# Patient Record
Sex: Female | Born: 1985 | Race: Black or African American | Hispanic: No | Marital: Married | Smoking: Never smoker
Health system: Southern US, Community
[De-identification: ages and names within clinical notes are randomized; demographics above are authoritative.]

## PROBLEM LIST (undated history)

## (undated) DIAGNOSIS — J302 Other seasonal allergic rhinitis: Secondary | ICD-10-CM

## (undated) DIAGNOSIS — L309 Dermatitis, unspecified: Secondary | ICD-10-CM

## (undated) DIAGNOSIS — Z8619 Personal history of other infectious and parasitic diseases: Secondary | ICD-10-CM

## (undated) DIAGNOSIS — N390 Urinary tract infection, site not specified: Secondary | ICD-10-CM

## (undated) DIAGNOSIS — Z973 Presence of spectacles and contact lenses: Secondary | ICD-10-CM

## (undated) DIAGNOSIS — D509 Iron deficiency anemia, unspecified: Secondary | ICD-10-CM

## (undated) DIAGNOSIS — O99119 Other diseases of the blood and blood-forming organs and certain disorders involving the immune mechanism complicating pregnancy, unspecified trimester: Secondary | ICD-10-CM

## (undated) DIAGNOSIS — D696 Thrombocytopenia, unspecified: Secondary | ICD-10-CM

## (undated) DIAGNOSIS — D259 Leiomyoma of uterus, unspecified: Secondary | ICD-10-CM

## (undated) HISTORY — DX: Dermatitis, unspecified: L30.9

## (undated) HISTORY — DX: Thrombocytopenia, unspecified: D69.6

## (undated) HISTORY — DX: Urinary tract infection, site not specified: N39.0

## (undated) HISTORY — PX: WRIST SURGERY: SHX841

## (undated) HISTORY — DX: Thrombocytopenia, unspecified: O99.119

## (undated) HISTORY — DX: Personal history of other infectious and parasitic diseases: Z86.19

---

## 2010-06-01 DIAGNOSIS — L258 Unspecified contact dermatitis due to other agents: Secondary | ICD-10-CM | POA: Insufficient documentation

## 2013-07-18 ENCOUNTER — Encounter: Payer: Self-pay | Admitting: Obstetrics & Gynecology

## 2013-08-01 ENCOUNTER — Encounter: Payer: Self-pay | Admitting: Nurse Practitioner

## 2013-08-15 ENCOUNTER — Encounter: Payer: Self-pay | Admitting: Obstetrics and Gynecology

## 2013-08-15 ENCOUNTER — Ambulatory Visit (INDEPENDENT_AMBULATORY_CARE_PROVIDER_SITE_OTHER): Payer: BC Managed Care – PPO | Admitting: Obstetrics and Gynecology

## 2013-08-15 VITALS — BP 118/89 | HR 93 | Ht 64.0 in | Wt 169.0 lb

## 2013-08-15 DIAGNOSIS — Z01419 Encounter for gynecological examination (general) (routine) without abnormal findings: Secondary | ICD-10-CM

## 2013-08-15 DIAGNOSIS — N898 Other specified noninflammatory disorders of vagina: Secondary | ICD-10-CM

## 2013-08-15 DIAGNOSIS — Z124 Encounter for screening for malignant neoplasm of cervix: Secondary | ICD-10-CM

## 2013-08-15 MED ORDER — NORGESTIMATE-ETH ESTRADIOL 0.25-35 MG-MCG PO TABS: 1.0000 | ORAL_TABLET | Freq: Every day | ORAL | Status: DC

## 2013-08-15 NOTE — Patient Instructions (Signed)
Preventive Care for Adults, Female A healthy lifestyle and preventive care can promote health and wellness. Preventive health guidelines for women include the following key practices.  A routine yearly physical is a good way to check with your health care provider about your health and preventive screening. It is a chance to share any concerns and updates on your health and to receive a thorough exam.  Visit your dentist for a routine exam and preventive care every 6 months. Brush your teeth twice a day and floss once a day. Good oral hygiene prevents tooth decay and gum disease.  The frequency of eye exams is based on your age, health, family medical history, use of contact lenses, and other factors. Follow your health care provider's recommendations for frequency of eye exams.  Eat a healthy diet. Foods like vegetables, fruits, whole grains, low-fat dairy products, and lean protein foods contain the nutrients you need without too many calories. Decrease your intake of foods high in solid fats, added sugars, and salt. Eat the right amount of calories for you.Get information about a proper diet from your health care provider, if necessary.  Regular physical exercise is one of the most important things you can do for your health. Most adults should get at least 150 minutes of moderate-intensity exercise (any activity that increases your heart rate and causes you to sweat) each week. In addition, most adults need muscle-strengthening exercises on 2 or more days a week.  Maintain a healthy weight. The body mass index (BMI) is a screening tool to identify possible weight problems. It provides an estimate of body fat based on height and weight. Your health care provider can find your BMI, and can help you achieve or maintain a healthy weight.For adults 20 years and older:  A BMI below 18.5 is considered underweight.  A BMI of 18.5 to 24.9 is normal.  A BMI of 25 to 29.9 is considered  overweight.  A BMI of 30 and above is considered obese.  Maintain normal blood lipids and cholesterol levels by exercising and minimizing your intake of saturated fat. Eat a balanced diet with plenty of fruit and vegetables. Blood tests for lipids and cholesterol should begin at age 20 and be repeated every 5 years. If your lipid or cholesterol levels are high, you are over 50, or you are at high risk for heart disease, you may need your cholesterol levels checked more frequently.Ongoing high lipid and cholesterol levels should be treated with medicines if diet and exercise are not working.  If you smoke, find out from your health care provider how to quit. If you do not use tobacco, do not start.  Lung cancer screening is recommended for adults aged 55 80 years who are at high risk for developing lung cancer because of a history of smoking. A yearly low-dose CT scan of the lungs is recommended for people who have at least a 30-pack-year history of smoking and are a current smoker or have quit within the past 15 years. A pack year of smoking is smoking an average of 1 pack of cigarettes a day for 1 year (for example: 1 pack a day for 30 years or 2 packs a day for 15 years). Yearly screening should continue until the smoker has stopped smoking for at least 15 years. Yearly screening should be stopped for people who develop a health problem that would prevent them from having lung cancer treatment.  If you are pregnant, do not drink alcohol. If you   are breastfeeding, be very cautious about drinking alcohol. If you are not pregnant and choose to drink alcohol, do not have more than 1 drink per day. One drink is considered to be 12 ounces (355 mL) of beer, 5 ounces (148 mL) of wine, or 1.5 ounces (44 mL) of liquor.  Avoid use of street drugs. Do not share needles with anyone. Ask for help if you need support or instructions about stopping the use of drugs.  High blood pressure causes heart disease and  increases the risk of stroke. Your blood pressure should be checked at least every 1 to 2 years. Ongoing high blood pressure should be treated with medicines if weight loss and exercise do not work.  If you are 20 30 years old, ask your health care provider if you should take aspirin to prevent strokes.  Diabetes screening involves taking a blood sample to check your fasting blood sugar level. This should be done once every 3 years, after age 35, if you are within normal weight and without risk factors for diabetes. Testing should be considered at a younger age or be carried out more frequently if you are overweight and have at least 1 risk factor for diabetes.  Breast cancer screening is essential preventive care for women. You should practice "breast self-awareness." This means understanding the normal appearance and feel of your breasts and may include breast self-examination. Any changes detected, no matter how small, should be reported to a health care provider. Women in their 42s and 30s should have a clinical breast exam (CBE) by a health care provider as part of a regular health exam every 1 to 3 years. After age 74, women should have a CBE every year. Starting at age 43, women should consider having a mammogram (breast X-ray test) every year. Women who have a family history of breast cancer should talk to their health care provider about genetic screening. Women at a high risk of breast cancer should talk to their health care providers about having an MRI and a mammogram every year.  Breast cancer gene (BRCA)-related cancer risk assessment is recommended for women who have family members with BRCA-related cancers. BRCA-related cancers include breast, ovarian, tubal, and peritoneal cancers. Having family members with these cancers may be associated with an increased risk for harmful changes (mutations) in the breast cancer genes BRCA1 and BRCA2. Results of the assessment will determine the need for  genetic counseling and BRCA1 and BRCA2 testing.  The Pap test is a screening test for cervical cancer. A Pap test can show cell changes on the cervix that might become cervical cancer if left untreated. A Pap test is a procedure in which cells are obtained and examined from the lower end of the uterus (cervix).  Women should have a Pap test starting at age 60.  Between ages 63 and 62, Pap tests should be repeated every 2 years.  Beginning at age 43, you should have a Pap test every 3 years as long as the past 3 Pap tests have been normal.  Some women have medical problems that increase the chance of getting cervical cancer. Talk to your health care provider about these problems. It is especially important to talk to your health care provider if a new problem develops soon after your last Pap test. In these cases, your health care provider may recommend more frequent screening and Pap tests.  The above recommendations are the same for women who have or have not gotten the vaccine  for human papillomavirus (HPV).  If you had a hysterectomy for a problem that was not cancer or a condition that could lead to cancer, then you no longer need Pap tests. Even if you no longer need a Pap test, a regular exam is a good idea to make sure no other problems are starting.  If you are between ages 65 and 70 years, and you have had normal Pap tests going back 10 years, you no longer need Pap tests. Even if you no longer need a Pap test, a regular exam is a good idea to make sure no other problems are starting.  If you have had past treatment for cervical cancer or a condition that could lead to cancer, you need Pap tests and screening for cancer for at least 20 years after your treatment.  If Pap tests have been discontinued, risk factors (such as a new sexual partner) need to be reassessed to determine if screening should be resumed.  The HPV test is an additional test that may be used for cervical cancer  screening. The HPV test looks for the virus that can cause the cell changes on the cervix. The cells collected during the Pap test can be tested for HPV. The HPV test could be used to screen women aged 30 years and older, and should be used in women of any age who have unclear Pap test results. After the age of 30, women should have HPV testing at the same frequency as a Pap test.  Colorectal cancer can be detected and often prevented. Most routine colorectal cancer screening begins at the age of 50 years and continues through age 75 years. However, your health care provider may recommend screening at an earlier age if you have risk factors for colon cancer. On a yearly basis, your health care provider may provide home test kits to check for hidden blood in the stool. Use of a small camera at the end of a tube, to directly examine the colon (sigmoidoscopy or colonoscopy), can detect the earliest forms of colorectal cancer. Talk to your health care provider about this at age 50, when routine screening begins. Direct exam of the colon should be repeated every 5 10 years through age 75 years, unless early forms of pre-cancerous polyps or small growths are found.  People who are at an increased risk for hepatitis B should be screened for this virus. You are considered at high risk for hepatitis B if:  You were born in a country where hepatitis B occurs often. Talk with your health care provider about which countries are considered high risk.  Your parents were born in a high-risk country and you have not received a shot to protect against hepatitis B (hepatitis B vaccine).  You have HIV or AIDS.  You use needles to inject street drugs.  You live with, or have sex with, someone who has Hepatitis B.  You get hemodialysis treatment.  You take certain medicines for conditions like cancer, organ transplantation, and autoimmune conditions.  Hepatitis C blood testing is recommended for all people born from  1945 through 1965 and any individual with known risks for hepatitis C.  Practice safe sex. Use condoms and avoid high-risk sexual practices to reduce the spread of sexually transmitted infections (STIs). STIs include gonorrhea, chlamydia, syphilis, trichomonas, herpes, HPV, and human immunodeficiency virus (HIV). Herpes, HIV, and HPV are viral illnesses that have no cure. They can result in disability, cancer, and death. Sexually active women aged 25   years and younger should be checked for chlamydia. Older women with new or multiple partners should also be tested for chlamydia. Testing for other STIs is recommended if you are sexually active and at increased risk.  Osteoporosis is a disease in which the bones lose minerals and strength with aging. This can result in serious bone fractures or breaks. The risk of osteoporosis can be identified using a bone density scan. Women ages 65 years and over and women at risk for fractures or osteoporosis should discuss screening with their health care providers. Ask your health care provider whether you should take a calcium supplement or vitamin D to reduce the rate of osteoporosis.  Menopause can be associated with physical symptoms and risks. Hormone replacement therapy is available to decrease symptoms and risks. You should talk to your health care provider about whether hormone replacement therapy is right for you.  Use sunscreen. Apply sunscreen liberally and repeatedly throughout the day. You should seek shade when your shadow is shorter than you. Protect yourself by wearing long sleeves, pants, a wide-brimmed hat, and sunglasses year round, whenever you are outdoors.  Once a month, do a whole body skin exam, using a mirror to look at the skin on your back. Tell your health care provider of new moles, moles that have irregular borders, moles that are larger than a pencil eraser, or moles that have changed in shape or color.  Stay current with required  vaccines (immunizations).  Influenza vaccine. All adults should be immunized every year.  Tetanus, diphtheria, and acellular pertussis (Td, Tdap) vaccine. Pregnant women should receive 1 dose of Tdap vaccine during each pregnancy. The dose should be obtained regardless of the length of time since the last dose. Immunization is preferred during the 27th 36th week of gestation. An adult who has not previously received Tdap or who does not know her vaccine status should receive 1 dose of Tdap. This initial dose should be followed by tetanus and diphtheria toxoids (Td) booster doses every 10 years. Adults with an unknown or incomplete history of completing a 3-dose immunization series with Td-containing vaccines should begin or complete a primary immunization series including a Tdap dose. Adults should receive a Td booster every 10 years.  Varicella vaccine. An adult without evidence of immunity to varicella should receive 2 doses or a second dose if she has previously received 1 dose. Pregnant females who do not have evidence of immunity should receive the first dose after pregnancy. This first dose should be obtained before leaving the health care facility. The second dose should be obtained 4 8 weeks after the first dose.  Human papillomavirus (HPV) vaccine. Females aged 13 26 years who have not received the vaccine previously should obtain the 3-dose series. The vaccine is not recommended for use in pregnant females. However, pregnancy testing is not needed before receiving a dose. If a female is found to be pregnant after receiving a dose, no treatment is needed. In that case, the remaining doses should be delayed until after the pregnancy. Immunization is recommended for any person with an immunocompromised condition through the age of 26 years if she did not get any or all doses earlier. During the 3-dose series, the second dose should be obtained 4 8 weeks after the first dose. The third dose should be  obtained 24 weeks after the first dose and 16 weeks after the second dose.  Zoster vaccine. One dose is recommended for adults aged 60 years or older unless certain   conditions are present.  Measles, mumps, and rubella (MMR) vaccine. Adults born before 1957 generally are considered immune to measles and mumps. Adults born in 1957 or later should have 1 or more doses of MMR vaccine unless there is a contraindication to the vaccine or there is laboratory evidence of immunity to each of the three diseases. A routine second dose of MMR vaccine should be obtained at least 28 days after the first dose for students attending postsecondary schools, health care workers, or international travelers. People who received inactivated measles vaccine or an unknown type of measles vaccine during 1963 1967 should receive 2 doses of MMR vaccine. People who received inactivated mumps vaccine or an unknown type of mumps vaccine before 1979 and are at high risk for mumps infection should consider immunization with 2 doses of MMR vaccine. For females of childbearing age, rubella immunity should be determined. If there is no evidence of immunity, females who are not pregnant should be vaccinated. If there is no evidence of immunity, females who are pregnant should delay immunization until after pregnancy. Unvaccinated health care workers born before 1957 who lack laboratory evidence of measles, mumps, or rubella immunity or laboratory confirmation of disease should consider measles and mumps immunization with 2 doses of MMR vaccine or rubella immunization with 1 dose of MMR vaccine.  Pneumococcal 13-valent conjugate (PCV13) vaccine. When indicated, a person who is uncertain of her immunization history and has no record of immunization should receive the PCV13 vaccine. An adult aged 19 years or older who has certain medical conditions and has not been previously immunized should receive 1 dose of PCV13 vaccine. This PCV13 should be  followed with a dose of pneumococcal polysaccharide (PPSV23) vaccine. The PPSV23 vaccine dose should be obtained at least 8 weeks after the dose of PCV13 vaccine. An adult aged 19 years or older who has certain medical conditions and previously received 1 or more doses of PPSV23 vaccine should receive 1 dose of PCV13. The PCV13 vaccine dose should be obtained 1 or more years after the last PPSV23 vaccine dose.  Pneumococcal polysaccharide (PPSV23) vaccine. When PCV13 is also indicated, PCV13 should be obtained first. All adults aged 65 years and older should be immunized. An adult younger than age 65 years who has certain medical conditions should be immunized. Any person who resides in a nursing home or long-term care facility should be immunized. An adult smoker should be immunized. People with an immunocompromised condition and certain other conditions should receive both PCV13 and PPSV23 vaccines. People with human immunodeficiency virus (HIV) infection should be immunized as soon as possible after diagnosis. Immunization during chemotherapy or radiation therapy should be avoided. Routine use of PPSV23 vaccine is not recommended for American Indians, [place] Natives, or people younger than 65 years unless there are medical conditions that require PPSV23 vaccine. When indicated, people who have unknown immunization and have no record of immunization should receive PPSV23 vaccine. One-time revaccination 5 years after the first dose of PPSV23 is recommended for people aged 19 64 years who have chronic kidney failure, nephrotic syndrome, asplenia, or immunocompromised conditions. People who received 1 2 doses of PPSV23 before age 65 years should receive another dose of PPSV23 vaccine at age 65 years or later if at least 5 years have passed since the previous dose. Doses of PPSV23 are not needed for people immunized with PPSV23 at or after age 65 years.  Meningococcal vaccine. Adults with asplenia or persistent  complement component deficiencies should receive 2   doses of quadrivalent meningococcal conjugate (MenACWY-D) vaccine. The doses should be obtained at least 2 months apart. Microbiologists working with certain meningococcal bacteria, military recruits, people at risk during an outbreak, and people who travel to or live in countries with a high rate of meningitis should be immunized. A first-year college student up through age 21 years who is living in a residence hall should receive a dose if she did not receive a dose on or after her 16th birthday. Adults who have certain high-risk conditions should receive one or more doses of vaccine.  Hepatitis A vaccine. Adults who wish to be protected from this disease, have certain high-risk conditions, work with hepatitis A-infected animals, work in hepatitis A research labs, or travel to or work in countries with a high rate of hepatitis A should be immunized. Adults who were previously unvaccinated and who anticipate close contact with an international adoptee during the first 60 days after arrival in the [place] from a country with a high rate of hepatitis A should be immunized.  Hepatitis B vaccine. Adults who wish to be protected from this disease, have certain high-risk conditions, may be exposed to blood or other infectious body fluids, are household contacts or sex partners of hepatitis B positive people, are clients or workers in certain care facilities, or travel to or work in countries with a high rate of hepatitis B should be immunized.  Haemophilus influenzae type b (Hib) vaccine. A previously unvaccinated person with asplenia or sickle cell disease or having a scheduled splenectomy should receive 1 dose of Hib vaccine. Regardless of previous immunization, a recipient of a hematopoietic stem cell transplant should receive a 3-dose series 6 12 months after her successful transplant. Hib vaccine is not recommended for adults with HIV  infection. Preventive Services / Frequency Ages 19 to 39years  Blood pressure check.** / Every 1 to 2 years.  Lipid and cholesterol check.** / Every 5 years beginning at age 20.  Clinical breast exam.** / Every 3 years for women in their 20s and 30s.  BRCA-related cancer risk assessment.** / For women who have family members with a BRCA-related cancer (breast, ovarian, tubal, or peritoneal cancers).  Pap test.** / Every 2 years from ages 21 through 29. Every 3 years starting at age 30 through age 65 or 70 with a history of 3 consecutive normal Pap tests.  HPV screening.** / Every 3 years from ages 30 through ages 65 to 70 with a history of 3 consecutive normal Pap tests.  Hepatitis C blood test.** / For any individual with known risks for hepatitis C.  Skin self-exam. / Monthly.  Influenza vaccine. / Every year.  Tetanus, diphtheria, and acellular pertussis (Tdap, Td) vaccine.** / Consult your health care provider. Pregnant women should receive 1 dose of Tdap vaccine during each pregnancy. 1 dose of Td every 10 years.  Varicella vaccine.** / Consult your health care provider. Pregnant females who do not have evidence of immunity should receive the first dose after pregnancy.  HPV vaccine. / 3 doses over 6 months, if 26 and younger. The vaccine is not recommended for use in pregnant females. However, pregnancy testing is not needed before receiving a dose.  Measles, mumps, rubella (MMR) vaccine.** / You need at least 1 dose of MMR if you were born in 1957 or later. You may also need a 2nd dose. For females of childbearing age, rubella immunity should be determined. If there is no evidence of immunity, females who are not   pregnant should be vaccinated. If there is no evidence of immunity, females who are pregnant should delay immunization until after pregnancy.  Pneumococcal 13-valent conjugate (PCV13) vaccine.** / Consult your health care provider.  Pneumococcal polysaccharide (PPSV23)  vaccine.** / 1 to 2 doses if you smoke cigarettes or if you have certain conditions.  Meningococcal vaccine.** / 1 dose if you are age 88 to 41 years and a Market researcher living in a residence hall, or have one of several medical conditions, you need to get vaccinated against meningococcal disease. You may also need additional booster doses.  Hepatitis A vaccine.** / Consult your health care provider.  Hepatitis B vaccine.** / Consult your health care provider.  Haemophilus influenzae type b (Hib) vaccine.** / Consult your health care provider. Ages 45 to 64years  Blood pressure check.** / Every 1 to 2 years.  Lipid and cholesterol check.** / Every 5 years beginning at age 2 years.  Lung cancer screening. / Every year if you are aged 10 80 years and have a 30-pack-year history of smoking and currently smoke or have quit within the past 15 years. Yearly screening is stopped once you have quit smoking for at least 15 years or develop a health problem that would prevent you from having lung cancer treatment.  Clinical breast exam.** / Every year after age 28 years.  BRCA-related cancer risk assessment.** / For women who have family members with a BRCA-related cancer (breast, ovarian, tubal, or peritoneal cancers).  Mammogram.** / Every year beginning at age 63 years and continuing for as long as you are in good health. Consult with your health care provider.  Pap test.** / Every 3 years starting at age 71 years through age 39 or 90 years with a history of 3 consecutive normal Pap tests.  HPV screening.** / Every 3 years from ages 27 years through ages 32 to 72 years with a history of 3 consecutive normal Pap tests.  Fecal occult blood test (FOBT) of stool. / Every year beginning at age 40 years and continuing until age 29 years. You may not need to do this test if you get a colonoscopy every 10 years.  Flexible sigmoidoscopy or colonoscopy.** / Every 5 years for a flexible  sigmoidoscopy or every 10 years for a colonoscopy beginning at age 66 years and continuing until age 47 years.  Hepatitis C blood test.** / For all people born from [birth year] through 1967 and any individual with known risks for hepatitis C.  Skin self-exam. / Monthly.  Influenza vaccine. / Every year.  Tetanus, diphtheria, and acellular pertussis (Tdap/Td) vaccine.** / Consult your health care provider. Pregnant women should receive 1 dose of Tdap vaccine during each pregnancy. 1 dose of Td every 10 years.  Varicella vaccine.** / Consult your health care provider. Pregnant females who do not have evidence of immunity should receive the first dose after pregnancy.  Zoster vaccine.** / 1 dose for adults aged 39 years or older.  Measles, mumps, rubella (MMR) vaccine.** / You need at least 1 dose of MMR if you were born in 1959 or later. You may also need a 2nd dose. For females of childbearing age, rubella immunity should be determined. If there is no evidence of immunity, females who are not pregnant should be vaccinated. If there is no evidence of immunity, females who are pregnant should delay immunization until after pregnancy.  Pneumococcal 13-valent conjugate (PCV13) vaccine.** / Consult your health care provider.  Pneumococcal polysaccharide (PPSV23) vaccine.** / 1  to 2 doses if you smoke cigarettes or if you have certain conditions.  Meningococcal vaccine.** / Consult your health care provider.  Hepatitis A vaccine.** / Consult your health care provider.  Hepatitis B vaccine.** / Consult your health care provider.  Haemophilus influenzae type b (Hib) vaccine.** / Consult your health care provider. Ages 65 years and over  Blood pressure check.** / Every 1 to 2 years.  Lipid and cholesterol check.** / Every 5 years beginning at age 20 years.  Lung cancer screening. / Every year if you are aged 55 80 years and have a 30-pack-year history of smoking and currently smoke or have quit within the past 15 years.  Yearly screening is stopped once you have quit smoking for at least 15 years or develop a health problem that would prevent you from having lung cancer treatment.  Clinical breast exam.** / Every year after age 40 years.  BRCA-related cancer risk assessment.** / For women who have family members with a BRCA-related cancer (breast, ovarian, tubal, or peritoneal cancers).  Mammogram.** / Every year beginning at age 40 years and continuing for as long as you are in good health. Consult with your health care provider.  Pap test.** / Every 3 years starting at age 30 years through age 65 or 70 years with 3 consecutive normal Pap tests. Testing can be stopped between 65 and 70 years with 3 consecutive normal Pap tests and no abnormal Pap or HPV tests in the past 10 years.  HPV screening.** / Every 3 years from ages 30 years through ages 65 or 70 years with a history of 3 consecutive normal Pap tests. Testing can be stopped between 65 and 70 years with 3 consecutive normal Pap tests and no abnormal Pap or HPV tests in the past 10 years.  Fecal occult blood test (FOBT) of stool. / Every year beginning at age 50 years and continuing until age 75 years. You may not need to do this test if you get a colonoscopy every 10 years.  Flexible sigmoidoscopy or colonoscopy.** / Every 5 years for a flexible sigmoidoscopy or every 10 years for a colonoscopy beginning at age 50 years and continuing until age 75 years.  Hepatitis C blood test.** / For all people born from 1945 through 1965 and any individual with known risks for hepatitis C.  Osteoporosis screening.** / A one-time screening for women ages 65 years and over and women at risk for fractures or osteoporosis.  Skin self-exam. / Monthly.  Influenza vaccine. / Every year.  Tetanus, diphtheria, and acellular pertussis (Tdap/Td) vaccine.** / 1 dose of Td every 10 years.  Varicella vaccine.** / Consult your health care provider.  Zoster vaccine.** / 1  dose for adults aged 60 years or older.  Pneumococcal 13-valent conjugate (PCV13) vaccine.** / Consult your health care provider.  Pneumococcal polysaccharide (PPSV23) vaccine.** / 1 dose for all adults aged 65 years and older.  Meningococcal vaccine.** / Consult your health care provider.  Hepatitis A vaccine.** / Consult your health care provider.  Hepatitis B vaccine.** / Consult your health care provider.  Haemophilus influenzae type b (Hib) vaccine.** / Consult your health care provider. ** Family history and personal history of risk and conditions may change your health care provider's recommendations. Document Released: 07/18/2001 Document Revised: 03/12/2013 Document Reviewed: 10/17/2010 [employer] Patient Information 2014 [employer].  

## 2013-08-15 NOTE — Progress Notes (Signed)
  Subjective:     Denise Castillo is a 28 y.o. female G0P0 with LMP 08/06/2013 and BMI 29 who is here for a comprehensive physical exam. The patient reports no problems. Patient is sexually active using OCP for contraception. She also reports the occasional development of a discharge with fishy odor. She was tested and treated for BV and yeast recently. She reports that this odor seems to occur randomly and is well controlled on probiotics. Patient was tested for STD in November  History   Social History  . Marital Status: Single    Spouse Name: N/A    Number of Children: N/A  . Years of Education: N/A   Occupational History  . Not on file.   Social History Main Topics  . Smoking status: Never Smoker   . Smokeless tobacco: Never Used  . Alcohol Use: No  . Drug Use: No  . Sexual Activity: Yes    Partners: Male    Birth Control/ Protection: OCP, Pill   Other Topics Concern  . Not on file   Social History Narrative  . No narrative on file   No health maintenance topics applied.   History reviewed. No pertinent past medical history. History reviewed. No pertinent past surgical history. Family History  Problem Relation Age of Onset  . Hypertension Mother   . Hypertension Maternal Aunt   . Hypertension Maternal Uncle   . Hypertension Maternal Grandmother   . Hypertension Maternal Grandfather    History  Substance Use Topics  . Smoking status: Never Smoker   . Smokeless tobacco: Never Used  . Alcohol Use: No     Review of Systems A comprehensive review of systems was negative.   Objective:      GENERAL: Well-developed, well-nourished female in no acute distress.  HEENT: Normocephalic, atraumatic. Sclerae anicteric.  NECK: Supple. Normal thyroid.  LUNGS: Clear to auscultation bilaterally.  HEART: Regular rate and rhythm. BREASTS: Symmetric in size. No palpable masses or lymphadenopathy, skin changes, or nipple drainage. ABDOMEN: Soft, nontender, nondistended. No  organomegaly. PELVIC: Normal external female genitalia. Vagina is pink and rugated.  Normal discharge. Normal appearing cervix. Uterus is normal in size. No adnexal mass or tenderness. EXTREMITIES: No cyanosis, clubbing, or edema, 2+ distal pulses.    Assessment:    Healthy female exam.      Plan:    Pap smear collected Wet prep collected Patient advised to start self breast exam monthly Refill on Sprintec provided Patient advised to pay attention to certain vulva irritants such as personal soaps, detergent, pads, condoms and excessive washing which may all contribute to vaginitis. Patient agrees that she has noticed it more often with use of certain pads. Patient will be contacted with any abnormal results RTC in 1 year or prn See After Visit Summary for Counseling Recommendations

## 2013-08-16 LAB — WET PREP, GENITAL
CLUE CELLS WET PREP: NONE SEEN
Trich, Wet Prep: NONE SEEN
YEAST WET PREP: NONE SEEN

## 2014-01-16 ENCOUNTER — Ambulatory Visit (INDEPENDENT_AMBULATORY_CARE_PROVIDER_SITE_OTHER): Payer: BC Managed Care – PPO | Admitting: Obstetrics & Gynecology

## 2014-01-16 DIAGNOSIS — R300 Dysuria: Secondary | ICD-10-CM

## 2014-01-16 DIAGNOSIS — Z113 Encounter for screening for infections with a predominantly sexual mode of transmission: Secondary | ICD-10-CM

## 2014-01-16 NOTE — Progress Notes (Signed)
   Subjective:    Patient ID: Denise Castillo, female    DOB: [birth year], 28 y.o.   MRN: [id number]  HPI 28 yo S AA G0 here today because she had one occasion last week of left back pain while she was drinking. She was worried that she may have a UTI. She also continues to report an occasional "fishy" odor of her vagina. She takes a probiotic and uses a water device to push water into her vagina daily. This treatment does relieve the smell she reports.   Review of Systems     Objective:   Physical Exam No CVAT Abd- benign UA- entirely negative       Assessment & Plan:  Fishy odor per report- she had a negative wet prep recently and declines another exam today I will send a UC&S and call her if there is any growth I will check CT/GC today. Reassurance given

## 2014-01-18 LAB — URINE CULTURE
Colony Count: NO GROWTH
Organism ID, Bacteria: NO GROWTH

## 2014-01-19 LAB — GC/CHLAMYDIA PROBE AMP
CT PROBE, AMP APTIMA: NEGATIVE
GC Probe RNA: NEGATIVE

## 2014-08-17 ENCOUNTER — Other Ambulatory Visit: Payer: Self-pay | Admitting: Obstetrics and Gynecology

## 2014-08-19 LAB — CYTOLOGY - PAP

## 2015-07-16 ENCOUNTER — Other Ambulatory Visit: Payer: Self-pay | Admitting: Obstetrics and Gynecology

## 2015-07-16 ENCOUNTER — Other Ambulatory Visit (HOSPITAL_COMMUNITY)
Admission: RE | Admit: 2015-07-16 | Discharge: 2015-07-16 | Disposition: A | Discharge status: Home / Self Care | Payer: BLUE CROSS/BLUE SHIELD | Source: Ambulatory Visit | Attending: Obstetrics and Gynecology | Admitting: Obstetrics and Gynecology

## 2015-07-16 DIAGNOSIS — Z01419 Encounter for gynecological examination (general) (routine) without abnormal findings: Secondary | ICD-10-CM | POA: Insufficient documentation

## 2015-07-16 DIAGNOSIS — Z1151 Encounter for screening for human papillomavirus (HPV): Secondary | ICD-10-CM | POA: Insufficient documentation

## 2015-07-16 LAB — HM PAP SMEAR: HM Pap smear: NORMAL

## 2015-07-19 LAB — CYTOLOGY - PAP

## 2015-08-02 ENCOUNTER — Other Ambulatory Visit: Payer: Self-pay | Admitting: Obstetrics and Gynecology

## 2015-12-03 ENCOUNTER — Encounter (HOSPITAL_BASED_OUTPATIENT_CLINIC_OR_DEPARTMENT_OTHER): Payer: Self-pay | Admitting: *Deleted

## 2015-12-03 NOTE — Progress Notes (Signed)
NPO AFTER MN.  ARRIVE AT 1115.  NEEDS HG AND URINE PREG.

## 2015-12-14 ENCOUNTER — Encounter (HOSPITAL_BASED_OUTPATIENT_CLINIC_OR_DEPARTMENT_OTHER)
Admission: RE | Disposition: A | Discharge status: Home / Self Care | Payer: Self-pay | Source: Ambulatory Visit | Attending: Obstetrics and Gynecology

## 2015-12-14 ENCOUNTER — Ambulatory Visit (HOSPITAL_BASED_OUTPATIENT_CLINIC_OR_DEPARTMENT_OTHER): Payer: BLUE CROSS/BLUE SHIELD | Admitting: Anesthesiology

## 2015-12-14 ENCOUNTER — Ambulatory Visit (HOSPITAL_BASED_OUTPATIENT_CLINIC_OR_DEPARTMENT_OTHER)
Admission: RE | Admit: 2015-12-14 | Discharge: 2015-12-14 | Disposition: A | Discharge status: Home / Self Care | Payer: BLUE CROSS/BLUE SHIELD | Source: Ambulatory Visit | Attending: Obstetrics and Gynecology | Admitting: Obstetrics and Gynecology

## 2015-12-14 ENCOUNTER — Encounter (HOSPITAL_BASED_OUTPATIENT_CLINIC_OR_DEPARTMENT_OTHER): Payer: Self-pay | Admitting: *Deleted

## 2015-12-14 DIAGNOSIS — R102 Pelvic and perineal pain: Secondary | ICD-10-CM | POA: Insufficient documentation

## 2015-12-14 DIAGNOSIS — D259 Leiomyoma of uterus, unspecified: Secondary | ICD-10-CM | POA: Diagnosis present

## 2015-12-14 DIAGNOSIS — D251 Intramural leiomyoma of uterus: Secondary | ICD-10-CM | POA: Diagnosis not present

## 2015-12-14 DIAGNOSIS — N920 Excessive and frequent menstruation with regular cycle: Secondary | ICD-10-CM | POA: Diagnosis not present

## 2015-12-14 HISTORY — DX: Presence of spectacles and contact lenses: Z97.3

## 2015-12-14 HISTORY — DX: Leiomyoma of uterus, unspecified: D25.9

## 2015-12-14 HISTORY — PX: LAPAROSCOPIC GELPORT ASSISTED MYOMECTOMY: SHX6549

## 2015-12-14 HISTORY — DX: Iron deficiency anemia, unspecified: D50.9

## 2015-12-14 HISTORY — DX: Other seasonal allergic rhinitis: J30.2

## 2015-12-14 LAB — POCT PREGNANCY, URINE: Preg Test, Ur: NEGATIVE

## 2015-12-14 LAB — POCT HEMOGLOBIN-HEMACUE: Hemoglobin: 13.8 g/dL (ref 12.0–15.0)

## 2015-12-14 SURGERY — LAPAROSCOPIC GELPORT ASSISTED MYOMECTOMY
Anesthesia: General | Site: Abdomen

## 2015-12-14 MED ORDER — ONDANSETRON HCL 4 MG/2ML IJ SOLN
INTRAMUSCULAR | Status: AC
  Filled 2015-12-14: qty 2

## 2015-12-14 MED ORDER — MIDAZOLAM HCL 2 MG/2ML IJ SOLN
INTRAMUSCULAR | Status: AC
  Filled 2015-12-14: qty 2

## 2015-12-14 MED ORDER — DEXAMETHASONE SODIUM PHOSPHATE 4 MG/ML IJ SOLN
INTRAMUSCULAR | Status: DC | PRN
  Administered 2015-12-14: 8 mg via INTRAVENOUS

## 2015-12-14 MED ORDER — ONDANSETRON HCL 4 MG PO TABS: 4.0000 mg | ORAL_TABLET | Freq: Three times a day (TID) | ORAL | Status: DC | PRN

## 2015-12-14 MED ORDER — MEPERIDINE HCL 25 MG/ML IJ SOLN
6.2500 mg | INTRAMUSCULAR | Status: DC | PRN
  Filled 2015-12-14: qty 1

## 2015-12-14 MED ORDER — ROCURONIUM BROMIDE 100 MG/10ML IV SOLN
INTRAVENOUS | Status: DC | PRN
  Administered 2015-12-14: 50 mg via INTRAVENOUS

## 2015-12-14 MED ORDER — LIDOCAINE HCL (CARDIAC) 20 MG/ML IV SOLN
INTRAVENOUS | Status: AC
  Filled 2015-12-14: qty 5

## 2015-12-14 MED ORDER — LACTATED RINGERS IR SOLN
Status: DC | PRN
  Administered 2015-12-14: 3000 mL

## 2015-12-14 MED ORDER — GLYCOPYRROLATE 0.2 MG/ML IJ SOLN
INTRAMUSCULAR | Status: DC | PRN
  Administered 2015-12-14: 0.4 mg via INTRAVENOUS

## 2015-12-14 MED ORDER — DEXAMETHASONE SODIUM PHOSPHATE 10 MG/ML IJ SOLN
INTRAMUSCULAR | Status: AC
  Filled 2015-12-14: qty 1

## 2015-12-14 MED ORDER — SUGAMMADEX SODIUM 200 MG/2ML IV SOLN
INTRAVENOUS | Status: AC
  Filled 2015-12-14: qty 2

## 2015-12-14 MED ORDER — HYDROMORPHONE HCL 1 MG/ML IJ SOLN
INTRAMUSCULAR | Status: AC
  Filled 2015-12-14: qty 1

## 2015-12-14 MED ORDER — FENTANYL CITRATE (PF) 100 MCG/2ML IJ SOLN
INTRAMUSCULAR | Status: DC | PRN
  Administered 2015-12-14: 100 ug via INTRAVENOUS
  Administered 2015-12-14 (×3): 50 ug via INTRAVENOUS

## 2015-12-14 MED ORDER — SUGAMMADEX SODIUM 200 MG/2ML IV SOLN
INTRAVENOUS | Status: DC | PRN
  Administered 2015-12-14: 150.6 mg via INTRAVENOUS

## 2015-12-14 MED ORDER — PROPOFOL 10 MG/ML IV BOLUS
INTRAVENOUS | Status: DC | PRN
  Administered 2015-12-14: 125 mg via INTRAVENOUS

## 2015-12-14 MED ORDER — MIDAZOLAM HCL 2 MG/2ML IJ SOLN
INTRAMUSCULAR | Status: DC | PRN
  Administered 2015-12-14: 2 mg via INTRAVENOUS

## 2015-12-14 MED ORDER — ROCURONIUM BROMIDE 100 MG/10ML IV SOLN
INTRAVENOUS | Status: AC
  Filled 2015-12-14: qty 1

## 2015-12-14 MED ORDER — KETOROLAC TROMETHAMINE 30 MG/ML IJ SOLN
INTRAMUSCULAR | Status: DC | PRN
  Administered 2015-12-14: 30 mg via INTRAVENOUS

## 2015-12-14 MED ORDER — PROPOFOL 10 MG/ML IV BOLUS
INTRAVENOUS | Status: AC
  Filled 2015-12-14: qty 20

## 2015-12-14 MED ORDER — FENTANYL CITRATE (PF) 100 MCG/2ML IJ SOLN
INTRAMUSCULAR | Status: AC
  Filled 2015-12-14: qty 2

## 2015-12-14 MED ORDER — LIDOCAINE HCL (CARDIAC) 20 MG/ML IV SOLN
INTRAVENOUS | Status: DC | PRN
Start: 2015-12-14 — End: 2015-12-14
  Administered 2015-12-14: 100 mg via INTRAVENOUS

## 2015-12-14 MED ORDER — FENTANYL CITRATE (PF) 250 MCG/5ML IJ SOLN
INTRAMUSCULAR | Status: AC
  Filled 2015-12-14: qty 5

## 2015-12-14 MED ORDER — OXYCODONE-ACETAMINOPHEN 7.5-325 MG PO TABS: 1.0000 | ORAL_TABLET | ORAL | Status: DC | PRN

## 2015-12-14 MED ORDER — LACTATED RINGERS IV SOLN
INTRAVENOUS | Status: DC
  Administered 2015-12-14 (×4): via INTRAVENOUS
  Filled 2015-12-14: qty 1000

## 2015-12-14 MED ORDER — CEFAZOLIN SODIUM-DEXTROSE 2-4 GM/100ML-% IV SOLN
2.0000 g | INTRAVENOUS | Status: AC
  Administered 2015-12-14: 2 g via INTRAVENOUS
  Filled 2015-12-14: qty 100

## 2015-12-14 MED ORDER — HYDROMORPHONE HCL 1 MG/ML IJ SOLN
0.2500 mg | INTRAMUSCULAR | Status: DC | PRN
  Administered 2015-12-14 (×2): 0.5 mg via INTRAVENOUS
  Filled 2015-12-14: qty 1

## 2015-12-14 MED ORDER — ONDANSETRON HCL 4 MG/2ML IJ SOLN
INTRAMUSCULAR | Status: DC | PRN
  Administered 2015-12-14: 4 mg via INTRAVENOUS

## 2015-12-14 MED ORDER — KETOROLAC TROMETHAMINE 30 MG/ML IJ SOLN
INTRAMUSCULAR | Status: AC
  Filled 2015-12-14: qty 1

## 2015-12-14 MED ORDER — BUPIVACAINE-EPINEPHRINE 0.5% -1:200000 IJ SOLN
INTRAMUSCULAR | Status: DC | PRN
  Administered 2015-12-14: 5 mL

## 2015-12-14 MED ORDER — CEFAZOLIN SODIUM-DEXTROSE 2-4 GM/100ML-% IV SOLN
INTRAVENOUS | Status: AC
  Filled 2015-12-14: qty 100

## 2015-12-14 MED ORDER — ONDANSETRON HCL 4 MG/2ML IJ SOLN
4.0000 mg | Freq: Once | INTRAMUSCULAR | Status: AC | PRN
  Administered 2015-12-14: 4 mg via INTRAVENOUS
  Filled 2015-12-14: qty 2

## 2015-12-14 SURGICAL SUPPLY — 65 items
APPLICATOR COTTON TIP 6IN STRL (MISCELLANEOUS) ×3 IMPLANT
BLADE SURG 10 STRL SS (BLADE) ×3 IMPLANT
BLADE SURG 11 STRL SS (BLADE) ×3 IMPLANT
COVER MAYO STAND STRL (DRAPES) ×3 IMPLANT
DRAPE UNDERBUTTOCKS STRL (DRAPE) ×3 IMPLANT
DRSG TEGADERM 4X4.75 (GAUZE/BANDAGES/DRESSINGS) IMPLANT
DRSG TELFA 3X8 NADH (GAUZE/BANDAGES/DRESSINGS) ×3 IMPLANT
ELECT NEEDLE TIP 2.8 STRL (NEEDLE) IMPLANT
ELECT REM PT RETURN 9FT ADLT (ELECTROSURGICAL) ×3
ELECTRODE REM PT RTRN 9FT ADLT (ELECTROSURGICAL) ×1 IMPLANT
GLOVE BIO SURGEON STRL SZ8 (GLOVE) ×3 IMPLANT
GLOVE BIOGEL PI IND STRL 8.5 (GLOVE) ×1 IMPLANT
GLOVE BIOGEL PI INDICATOR 8.5 (GLOVE) ×2
GOWN STRL REUS W/ TWL LRG LVL3 (GOWN DISPOSABLE) ×2 IMPLANT
GOWN STRL REUS W/TWL LRG LVL3 (GOWN DISPOSABLE) ×4
KIT ROOM TURNOVER WOR (KITS) ×3 IMPLANT
LEGGING LITHOTOMY PAIR STRL (DRAPES) ×3 IMPLANT
LIQUID BAND (GAUZE/BANDAGES/DRESSINGS) ×3 IMPLANT
MANIPULATOR UTERINE 4.5 ZUMI (MISCELLANEOUS) ×3 IMPLANT
NDL SAFETY ECLIPSE 18X1.5 (NEEDLE) ×1 IMPLANT
NEEDLE HYPO 18GX1.5 SHARP (NEEDLE) ×2
NEEDLE HYPO 25X1 1.5 SAFETY (NEEDLE) ×3 IMPLANT
NEEDLE INSUFFLATION 14GA 120MM (NEEDLE) ×3 IMPLANT
NS IRRIG 500ML POUR BTL (IV SOLUTION) ×3 IMPLANT
PACK BASIN DAY SURGERY FS (CUSTOM PROCEDURE TRAY) ×3 IMPLANT
PACK LAPAROSCOPY II (CUSTOM PROCEDURE TRAY) ×3 IMPLANT
PAD ION RIGHT ARM DISP (MISCELLANEOUS) ×3 IMPLANT
PAD OB MATERNITY 4.3X12.25 (PERSONAL CARE ITEMS) ×3 IMPLANT
PENCIL BUTTON HOLSTER BLD 10FT (ELECTRODE) ×3 IMPLANT
SCALPEL HARMONIC ACE (MISCELLANEOUS) IMPLANT
SEPRAFILM MEMBRANE 5X6 (MISCELLANEOUS) ×6 IMPLANT
SET IRRIG TUBING LAPAROSCOPIC (IRRIGATION / IRRIGATOR) ×3 IMPLANT
SOLUTION ANTI FOG 6CC (MISCELLANEOUS) ×3 IMPLANT
SUT MNCRL AB 4-0 PS2 18 (SUTURE) ×3 IMPLANT
SUT MON AB 2-0 CT1 36 (SUTURE) ×6 IMPLANT
SUT PROLENE 0 CT 1 30 (SUTURE) IMPLANT
SUT VIC AB 0 CT1 36 (SUTURE) IMPLANT
SUT VIC AB 2-0 CT1 27 (SUTURE)
SUT VIC AB 2-0 CT1 TAPERPNT 27 (SUTURE) IMPLANT
SUT VIC AB 2-0 CT2 27 (SUTURE) IMPLANT
SUT VIC AB 2-0 UR6 27 (SUTURE) IMPLANT
SUT VIC AB 4-0 SH 27 (SUTURE)
SUT VIC AB 4-0 SH 27XANBCTRL (SUTURE) IMPLANT
SUT VICRYL 0 TIES 12 18 (SUTURE) IMPLANT
SUT VICRYL 4-0 PS2 18IN ABS (SUTURE) ×3 IMPLANT
SYR 20CC LL (SYRINGE) ×3 IMPLANT
SYR 30ML LL (SYRINGE) IMPLANT
SYR 3ML 23GX1 SAFETY (SYRINGE) IMPLANT
SYR 5ML LL (SYRINGE) ×3 IMPLANT
SYR CONTROL 10ML LL (SYRINGE) ×3 IMPLANT
SYRINGE 10CC LL (SYRINGE) ×3 IMPLANT
SYRINGE 60CC LL (MISCELLANEOUS) ×3 IMPLANT
SYS LAPSCP GELPORT 120MM (MISCELLANEOUS) ×3
SYSTEM LAPSCP GELPORT 120MM (MISCELLANEOUS) ×1 IMPLANT
TAPE STRIPS DRAPE STRL (GAUZE/BANDAGES/DRESSINGS) ×3 IMPLANT
TOWEL OR 17X24 6PK STRL BLUE (TOWEL DISPOSABLE) ×6 IMPLANT
TRAY DSU PREP LF (CUSTOM PROCEDURE TRAY) ×3 IMPLANT
TRAY FOLEY CATH SILVER 14FR (SET/KITS/TRAYS/PACK) ×3 IMPLANT
TROCAR OPTI TIP 5M 100M (ENDOMECHANICALS) ×6 IMPLANT
TROCAR XCEL DIL TIP R 11M (ENDOMECHANICALS) IMPLANT
TUBE CONNECTING 12'X1/4 (SUCTIONS) ×1
TUBE CONNECTING 12X1/4 (SUCTIONS) ×2 IMPLANT
TUBING INSUFFLATION 10FT LAP (TUBING) ×6 IMPLANT
WARMER LAPAROSCOPE (MISCELLANEOUS) ×3 IMPLANT
WATER STERILE IRR 500ML POUR (IV SOLUTION) ×3 IMPLANT

## 2015-12-14 NOTE — H&P (Signed)
Denise Castillo is a 30 y.o. female , originally referred to me by Dr. Simona Huh, for myomectomy. By recent ultrasound she was noted to have a large transmural myoma 9x9 cm which flattens the endometrium in cephalad-caudad direction. Patient would like to preserve her childbearing potential.  Pertinent Gynecological History: Menses: Normal Bleeding: Normal Contraception: Oral Contraceptive Pill DES exposure: denies Blood transfusions: none Sexually transmitted diseases: no past history Previous GYN Procedures: None  Last mammogram: normal Last pap: normal  OB History: G 0   Menstrual History: Menarche age: 12 No LMP recorded.    Past Medical History  Diagnosis Date  . Uterine fibroid   . Seasonal allergies   . Iron deficiency anemia   . Wears contact lenses                     Past Surgical History  Procedure Laterality Date  . No past surgeries               Family History  Problem Relation Age of Onset  . Hypertension Mother   . Hypertension Maternal Aunt   . Hypertension Maternal Uncle   . Hypertension Maternal Grandmother   . Hypertension Maternal Grandfather    No hereditary disease.  No cancer of breast, ovary, uterus. No cutaneous leiomyomatosis or renal cell carcinoma.  Social History   Social History  . Marital Status: Single    Spouse Name: N/A  . Number of Children: N/A  . Years of Education: N/A   Occupational History  . Not on file.   Social History Main Topics  . Smoking status: Never Smoker   . Smokeless tobacco: Never Used  . Alcohol Use: No  . Drug Use: No  . Sexual Activity:    Partners: Male    Birth Control/ Protection: OCP, Pill   Other Topics Concern  . Not on file   Social History Narrative    No Known Allergies  No current facility-administered medications on file prior to encounter.   No current outpatient prescriptions on file prior to encounter.     Review of Systems  Constitutional: Negative.   HENT:  Negative.   Eyes: Negative.   Respiratory: Negative.   Cardiovascular: Negative.   Gastrointestinal: Negative.   Genitourinary: Negative.   Musculoskeletal: Negative.   Skin: Negative.   Neurological: Negative.   Endo/Heme/Allergies: Negative.   Psychiatric/Behavioral: Negative.      Physical Exam  Ht 5\' 4"  (1.626 m)  Wt 74.844 kg (165 lb)  BMI 28.31 kg/m2  LMP 10/24/2015 (Exact Date) Constitutional: She is oriented to person, place, and time. She appears well-developed and well-nourished.  HENT:  Head: Normocephalic and atraumatic.  Nose: Nose normal.  Mouth/Throat: Oropharynx is clear and moist. No oropharyngeal exudate.  Eyes: Conjunctivae normal and EOM are normal. Pupils are equal, round, and reactive to light. No scleral icterus.  Neck: Normal range of motion. Neck supple. No tracheal deviation present. No thyromegaly present.  Cardiovascular: Normal rate.   Respiratory: Effort normal and breath sounds normal.  GI: Soft. Bowel sounds are normal. She exhibits no distension and no mass. There is no tenderness.  Lymphadenopathy:    She has no cervical adenopathy.  Neurological: She is alert and oriented to person, place, and time. She has normal reflexes.  Skin: Skin is warm.  Psychiatric: She has a normal mood and affect. Her behavior is normal. Judgment and thought content normal.       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 Preoperative for gel-port assisted myomectomy Benefits and risks of  myomectomy were discussed with the patient and her family member again.  Bowel prep instructions were given.  All of patient's questions were answered.  She verbalized understanding.  She knows that she will need a cesarean delivery for future pregnancies, and that it is recommended she does not conceive for 2-3 months for uterus to heal.

## 2015-12-14 NOTE — Anesthesia Preprocedure Evaluation (Addendum)
Anesthesia Evaluation  Patient identified by MRN, date of birth, ID band Patient awake    Reviewed: Allergy & Precautions, NPO status , Patient's Chart, lab work & pertinent test results  Airway Mallampati: I  TM Distance: >3 FB Neck ROM: Full    Dental  (+) Teeth Intact, Dental Advisory Given   Pulmonary neg pulmonary ROS,    Pulmonary exam normal        Cardiovascular Exercise Tolerance: Good Normal cardiovascular exam Rhythm:Regular Rate:Normal     Neuro/Psych negative neurological ROS     GI/Hepatic negative GI ROS, Neg liver ROS,   Endo/Other  negative endocrine ROS  Renal/GU negative Renal ROS  negative genitourinary   Musculoskeletal negative musculoskeletal ROS (+)   Abdominal   Peds  Hematology  (+) anemia ,   Anesthesia Other Findings   Reproductive/Obstetrics Uterine fibroids                            Anesthesia Physical Anesthesia Plan  ASA: I  Anesthesia Plan: General   Post-op Pain Management:    Induction: Intravenous  Airway Management Planned: Oral ETT  Additional Equipment:   Intra-op Plan:   Post-operative Plan: Extubation in OR  Informed Consent: I have reviewed the patients History and Physical, chart, labs and discussed the procedure including the risks, benefits and alternatives for the proposed anesthesia with the patient or authorized representative who has indicated his/her understanding and acceptance.   Dental advisory given  Plan Discussed with: CRNA, Surgeon and Anesthesiologist  Anesthesia Plan Comments:        Anesthesia Quick Evaluation

## 2015-12-14 NOTE — Anesthesia Postprocedure Evaluation (Signed)
Anesthesia Post Note  Patient: Denise Castillo  Procedure(s) Performed: Procedure(s) (LRB): LAPAROSCOPIC GELPORT ASSISTED MYOMECTOMY (N/A)  Patient location during evaluation: PACU Anesthesia Type: General Level of consciousness: awake Pain management: pain level controlled Vital Signs Assessment: post-procedure vital signs reviewed and stable Respiratory status: spontaneous breathing Cardiovascular status: stable Postop Assessment: no signs of nausea or vomiting Anesthetic complications: no    Last Vitals:  Filed Vitals:   12/14/15 1600 12/14/15 1615  BP: 114/82 112/74  Pulse: 64 65  Temp:    Resp: 17 14    Last Pain:  Filed Vitals:   12/14/15 1627  PainSc: 4                  Lillie Portner

## 2015-12-14 NOTE — Transfer of Care (Signed)
Immediate Anesthesia Transfer of Care Note  Patient: Denise Castillo  Procedure(s) Performed: Procedure(s): LAPAROSCOPIC GELPORT ASSISTED MYOMECTOMY (N/A)  Patient Location: PACU  Anesthesia Type:General  Level of Consciousness: awake, alert  and oriented  Airway & Oxygen Therapy: Patient Spontanous Breathing and Patient connected to nasal cannula oxygen  Post-op Assessment: Report given to RN and Post -op Vital signs reviewed and stable  Post vital signs: Reviewed and stable  Last Vitals:  Filed Vitals:   12/14/15 1106  BP: 117/75  Pulse: 114  Temp: 36.8 C  Resp: 16    Last Pain: There were no vitals filed for this visit.    Patients Stated Pain Goal: 3 ([id number])  Complications: No apparent anesthesia complications

## 2015-12-14 NOTE — Anesthesia Procedure Notes (Signed)
Procedure Name: Intubation Date/Time: 12/14/2015 12:55 PM Performed by: Wanita Chamberlain Pre-anesthesia Checklist: Patient identified, Timeout performed, Emergency Drugs available, Suction available and Patient being monitored Patient Re-evaluated:Patient Re-evaluated prior to inductionOxygen Delivery Method: Circle system utilized Preoxygenation: Pre-oxygenation with 100% oxygen Intubation Type: IV induction Ventilation: Mask ventilation without difficulty Grade View: Grade I Tube type: Oral Tube size: 7.0 mm Number of attempts: 1 Airway Equipment and Method: Stylet

## 2015-12-14 NOTE — Discharge Instructions (Addendum)
Laparoscopy/ Care After Refer to this sheet in the next few weeks. These instructions provide you with information about caring for yourself after your procedure. Your health care provider may also give you more specific instructions. Your treatment has been planned according to current medical practices, but problems sometimes occur. Call your health care provider if you have any problems or questions after your procedure. WHAT TO EXPECT AFTER THE PROCEDURE After your procedure, it is common to have:  Sore throat.  Soreness at the incision site.  Mild cramping.  Tiredness.  Mild nausea or vomiting.  Shoulder pain. HOME CARE INSTRUCTIONS  Rest for the remainder of the day.  Take medicines only as directed by your health care provider. These include over-the-counter medicines and prescription medicines. Do not take aspirin, which can cause bleeding.  Over the next few days, gradually return to your normal activities and your normal diet.  Avoid sexual intercourse for 2 weeks or as directed by your health care provider.  Do not use tampons, and do not douche.  Do not drive or operate heavy machinery while taking pain medicine.  Do not lift anything that is heavier than 5 lb (2.3 kg) for 2 weeks or as directed by your health care provider.  Do not take baths. Take showers only. Ask your health care provider when you can start taking baths.  Try to have help for your household needs for the first 7-10 days.  There are many different ways to close and cover an incision, including stitches (sutures), skin glue, and adhesive strips. Follow instructions from your health care provider about:  Incision care.  Bandage (dressing) changes and removal.  Incision closure removal.  Check your incision area every day for signs of infection. Watch for:  Redness, swelling, or pain.  Fluid, blood, or pus.  Keep all follow-up visits as directed by your health care provider. SEEK MEDICAL  CARE IF:  You have redness, swelling, or increasing pain in your incision area.  You have fluid, blood, or pus coming from your incision for longer than 1 day.  You notice a bad smell coming from your incision or your dressing.  The edges of your incision break open after the sutures have been removed.  Your pain does not decrease after 2-3 days.  You have a rash.  You repeatedly become dizzy or light-headed.  You have a reaction to your medicine.  Your pain medicine is not helping.  You are constipated. SEEK IMMEDIATE MEDICAL CARE IF:  You have a fever.  You faint.  You have increasing pain in your abdomen.  You have severe pain in one or both of your shoulders.  You have bleeding or drainage from your suture sites or your vagina after surgery.  You have shortness of breath or have difficulty breathing.  You have chest pain or leg pain.  You have ongoing nausea, vomiting, or diarrhea.   This information is not intended to replace advice given to you by your health care provider. Make sure you discuss any questions you have with your health care provider.   Document Released: 12/09/2004 Document Revised: 10/06/2014 Document Reviewed: 09/02/2011 Elsevier Interactive Patient Education 2016 [place] Anest[facility] Home Care Instructions  Activity: Get plenty of rest for the remainder of the day. A responsible adult should stay with you for 24 hours following the procedure.  For the next 24 hours, DO NOT: -Drive a car -Paediatric nurse -Drink alcoholic beverages -Take any medication unless instructed by your  physician -Make any legal decisions or sign important papers.  Meals: Start with liquid foods such as gelatin or soup. Progress to regular foods as tolerated. Avoid greasy, spicy, heavy foods. If nausea and/or vomiting occur, drink only clear liquids until the nausea and/or vomiting subsides. Call your physician if vomiting continues.  Special  Instructions/Symptoms: Your throat may feel dry or sore from the anesthesia or the breathing tube placed in your throat during surgery. If this causes discomfort, gargle with warm salt water. The discomfort should disappear within 24 hours.  If you had a scopolamine patch placed behind your ear for the management of post- operative nausea and/or vomiting:  1. The medication in the patch is effective for 72 hours, after which it should be removed.  Wrap patch in a tissue and discard in the trash. Wash hands thoroughly with soap and water. 2. You may remove the patch earlier than 72 hours if you experience unpleasant side effects which may include dry mouth, dizziness or visual disturbances. 3. Avoid touching the patch. Wash your hands with soap and water after contact with the patch.

## 2015-12-17 ENCOUNTER — Encounter (HOSPITAL_BASED_OUTPATIENT_CLINIC_OR_DEPARTMENT_OTHER): Payer: Self-pay | Admitting: Obstetrics and Gynecology

## 2016-01-11 NOTE — Op Note (Signed)
OPERATIVE NOTE   Preoperative diagnosis:  Uterine myoma, pelvic pain and menorrhagia   Postoperative diagnosis: Uterine myoma, pelvic pain and menorrhagia   Procedure: Laparoscopy, GelPort assisted myomectomy, chromotubation  Anesthesia: Gen. endotracheal   Surgeon: Governor Specking   Assistant: Surgical tech  Complications: None   Estimated blood loss: 200 mL  Specimens: Myoma specimens, peritoneal excisional biopsies to pathology.   Findings: On exam under anesthesia, the uterus was firm enlarged to 14 gestational weeks size, and mobile. There was no posterior fornix nodularity or induration. No adnexal masses were palpable. On laparoscopy, liver edge, gallbladder, and diaphragm surfaces were normal. Appendix was not visualized. The uterus was enlarged with a anterior fundal 9 x 9 cm myoma that was intramural. Both fallopian tubes and ovaries appeared normal. There was no posterior cul-de-sac lesions.  Description of the procedure: Patient was placed in lithotomy position and general endotracheal anesthesia was given. 2 g of cefazolin were  given intravenously for prophylaxis. She was prepped and draped in sterile manner. A Foley catheter was inserted into the bladder appeared . A ZUMI uterine manipulator was inserted into the uterus for uterine manipulation and definition of the endometrial cavity and chromotubation.  An operative field was created on the abdomen and the surgeon was regloved. After preemptive anesthesia with quarter percent bupivacaine and 1:200,000 epinephrine, and intraumbilical skin incision was made. A Verress needle was inserted and pneumoperitoneum was created with carbon dioxide. A 5-mm trocar was placed at this incision. Video laparoscopy was started with a 30 laparoscope. A 5 mm incision was made in each lower quadrant and corresponding trochars were inserted under direct visualization. Ancillary instruments were used through these ports. As the case progressed,  we also made a 3 cm suprapubic transverse incision to place a GelPort. The incision was extended down to fascia. The fascia was nicked and incised in transverse elliptical manner. The rectus muscles were separated midline by blunt dissection. The peritoneum was elevated nicked and incised in vertical fashion. A GelPort was placed under laparoscopic visualization. This port was used as a fourth laparoscopic port at times and converted to a open access/minilaparotomy port at other times. A dilute vasopressin solution was injected into the myoma. The myometrium overlying the myoma was incised with needle electrode in anterior posterior direction. Fibroid dissection was started by blunt and sharp dissection method. At this point we found it more convenient to open the GelPort and grasped myoma with a towel clip and digitally and by sharp dissection dissected rest of the myoma. The sheer size of the myoma did not allow it to be completely extracted therefore we resorted to in situ morcellation method and decompressed the myoma in its location and thus were able to access to deeper and more lateral portions of the rest of the myoma. Medially the endometrium was encountered but not entered. After meticulous dissection and in situ morcellation the entire myoma could be dissected out and removed in pieces. The redundant myometrium it had been stretched was trimmed off. The myoma defect was closed in 3 layers: The first layer was a deep myometrial suture of 2-0 Vicryl continuous interlocking suture. The second layer was a superficial myometrial continuous suture of 2-0 Vicryl. The serosa was approximated with 4-0 Vicryl continuous suture. The GelPort was closed and laparoscopy was resumed. Chromotubation was repeated the tubes did not fill but the attributed to a technical issue as the proximal portions of the tubes appeared normal. Pelvis was copiously irrigated with lactated Ringer solution and a slurry  of Seprafilm (2  sheets in 40 mL of lactated Ringer solution) was instilled into the pelvis as an adhesion barrier. The fascia of the 3 cm transverse incision was approximated with 2-0 Vicryl continuous suture, after the hemostasis on the rectus muscles was checked. Subcutaneous tissue was irrigated and hemostasis was insured. The skin was approximated with 4-0 Monocryl in subcuticular stitches. The rest of the skin incisions were approximated with Dermabond.   At this point the procedure was terminated hemostasis was insured. Instrument and lap pad count was correct.   The patient tolerated the procedure well and was transferred to recovery room in satisfactory condition.  Special Note: Due to to significant myometrial incision, the patient is recommended to have cesarean delivery with future pregnancies.   Governor Specking, MD

## 2016-05-13 ENCOUNTER — Other Ambulatory Visit: Payer: Self-pay | Admitting: Obstetrics and Gynecology

## 2017-05-04 LAB — OB RESULTS CONSOLE ABO/RH: RH TYPE: POSITIVE

## 2017-05-04 LAB — OB RESULTS CONSOLE ANTIBODY SCREEN: Antibody Screen: NEGATIVE

## 2017-05-04 LAB — OB RESULTS CONSOLE HIV ANTIBODY (ROUTINE TESTING): HIV: NONREACTIVE

## 2017-05-04 LAB — OB RESULTS CONSOLE GC/CHLAMYDIA
Chlamydia: NEGATIVE
Gonorrhea: NEGATIVE

## 2017-05-04 LAB — OB RESULTS CONSOLE RUBELLA ANTIBODY, IGM: Rubella: IMMUNE

## 2017-05-04 LAB — OB RESULTS CONSOLE RPR: RPR: NONREACTIVE

## 2017-05-04 LAB — OB RESULTS CONSOLE HEPATITIS B SURFACE ANTIGEN: Hepatitis B Surface Ag: NEGATIVE

## 2017-05-08 LAB — HM HIV SCREENING LAB: HM HIV Screening: NEGATIVE

## 2017-07-13 ENCOUNTER — Other Ambulatory Visit: Payer: Self-pay

## 2017-08-07 ENCOUNTER — Other Ambulatory Visit: Payer: Self-pay | Admitting: Obstetrics and Gynecology

## 2017-08-09 ENCOUNTER — Encounter (HOSPITAL_COMMUNITY): Payer: Self-pay

## 2017-08-09 ENCOUNTER — Other Ambulatory Visit: Payer: Self-pay | Admitting: Obstetrics and Gynecology

## 2017-08-09 DIAGNOSIS — Z3689 Encounter for other specified antenatal screening: Secondary | ICD-10-CM

## 2017-08-10 ENCOUNTER — Other Ambulatory Visit: Payer: Self-pay | Admitting: Obstetrics and Gynecology

## 2017-08-10 ENCOUNTER — Ambulatory Visit (HOSPITAL_COMMUNITY)
Admission: RE | Admit: 2017-08-10 | Discharge: 2017-08-10 | Disposition: A | Discharge status: Home / Self Care | Payer: BLUE CROSS/BLUE SHIELD | Source: Ambulatory Visit | Attending: Obstetrics and Gynecology | Admitting: Obstetrics and Gynecology

## 2017-08-10 ENCOUNTER — Encounter (HOSPITAL_COMMUNITY): Payer: Self-pay

## 2017-08-10 DIAGNOSIS — D259 Leiomyoma of uterus, unspecified: Secondary | ICD-10-CM | POA: Diagnosis not present

## 2017-08-10 DIAGNOSIS — O3412 Maternal care for benign tumor of corpus uteri, second trimester: Secondary | ICD-10-CM | POA: Diagnosis not present

## 2017-08-10 DIAGNOSIS — Z3A21 21 weeks gestation of pregnancy: Secondary | ICD-10-CM | POA: Insufficient documentation

## 2017-08-10 DIAGNOSIS — Z3689 Encounter for other specified antenatal screening: Secondary | ICD-10-CM | POA: Diagnosis present

## 2017-08-10 DIAGNOSIS — O3492 Maternal care for abnormality of pelvic organ, unspecified, second trimester: Secondary | ICD-10-CM | POA: Diagnosis not present

## 2017-08-10 DIAGNOSIS — O4392 Unspecified placental disorder, second trimester: Secondary | ICD-10-CM

## 2017-08-10 DIAGNOSIS — O3429 Maternal care due to uterine scar from other previous surgery: Secondary | ICD-10-CM

## 2017-08-10 IMAGING — US US MFM OB DETAIL+14 WK
1 series · 13 of 28 positions shown · non-contrast
Comparison: none

[Series 1: us mfm ob detail+14 wk · 113 acquisitions, 13 frames shown]
[im 5/113]
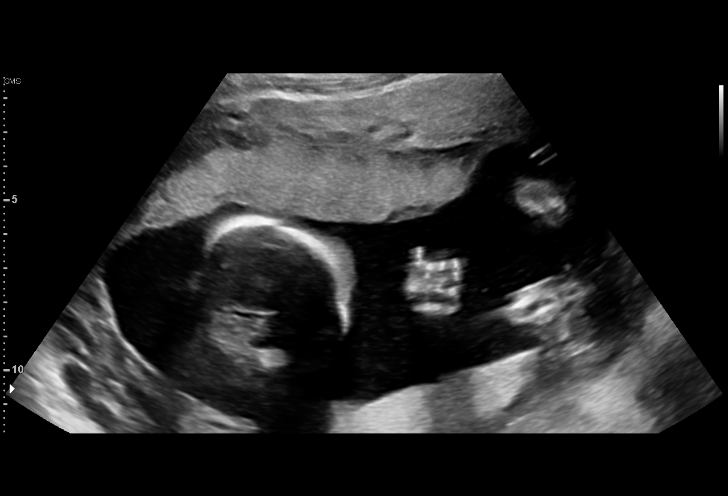
[im 13/113]
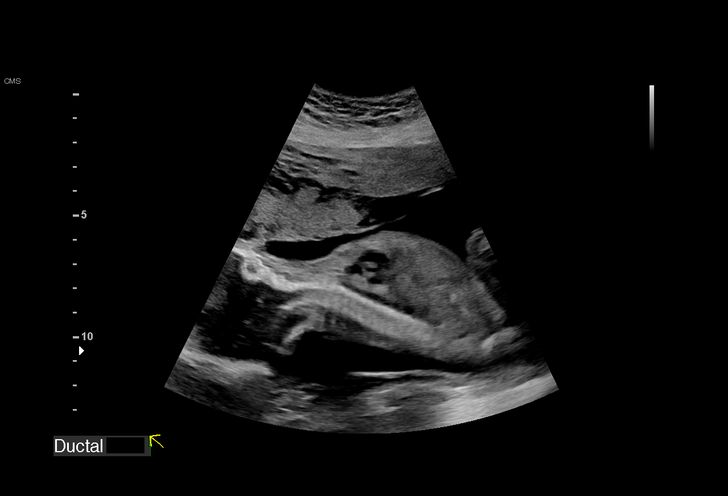
[im 21/113]
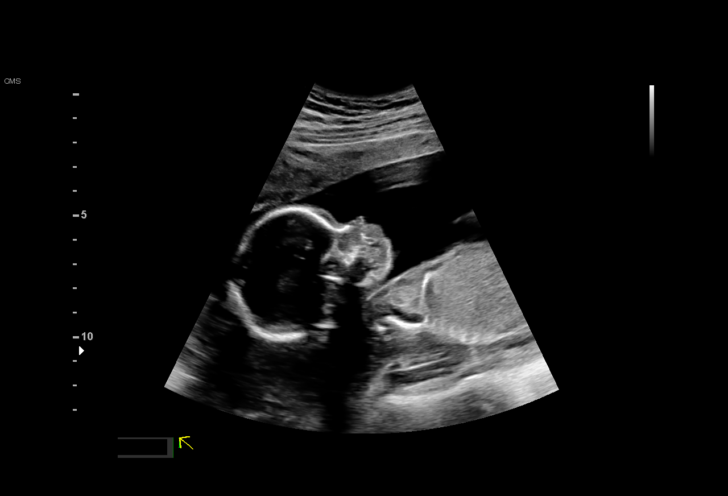
[im 30/113]
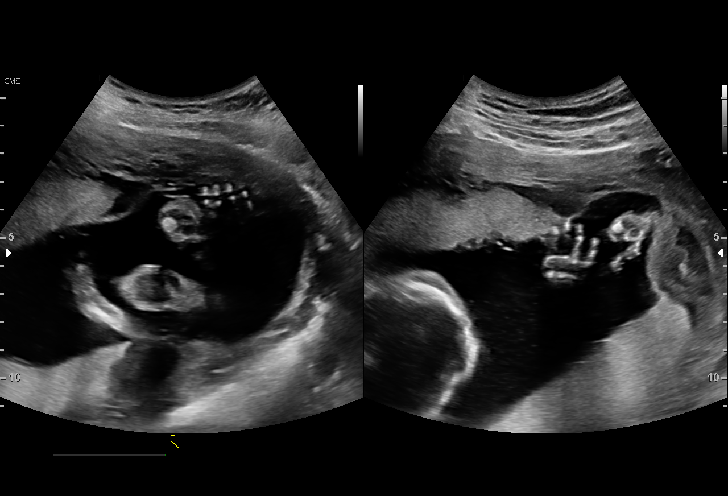
[im 38/113]
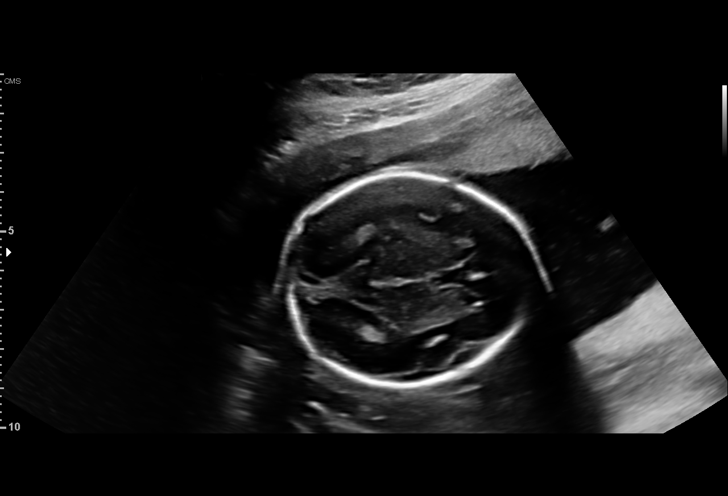
[im 46/113]
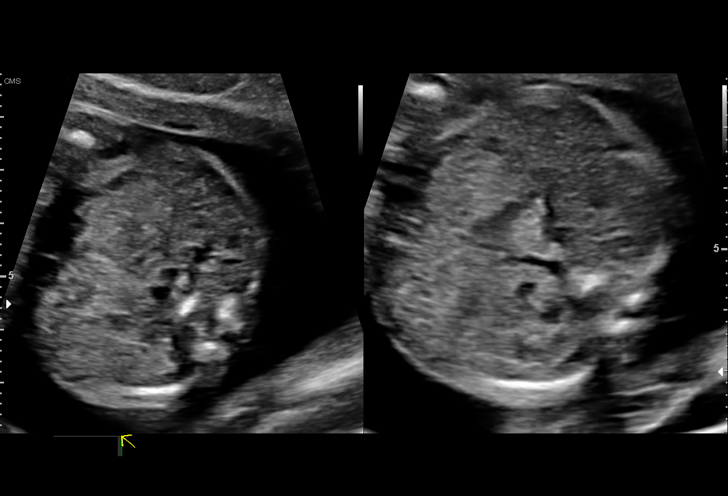
[im 59/113]
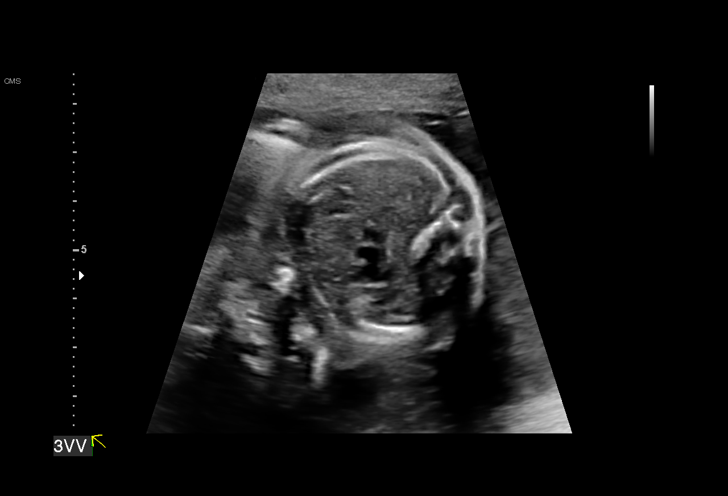
[im 67/113]
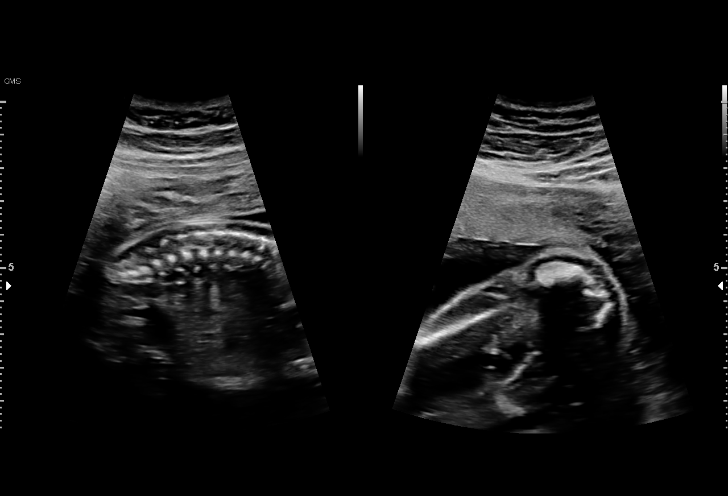
[im 75/113]
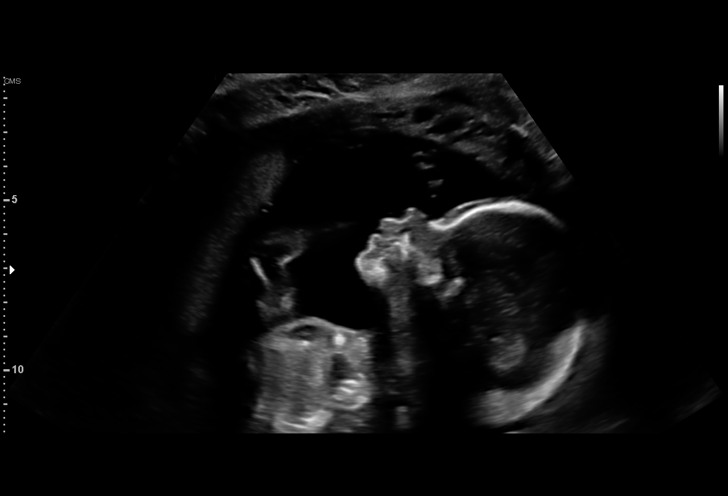
[im 83/113]
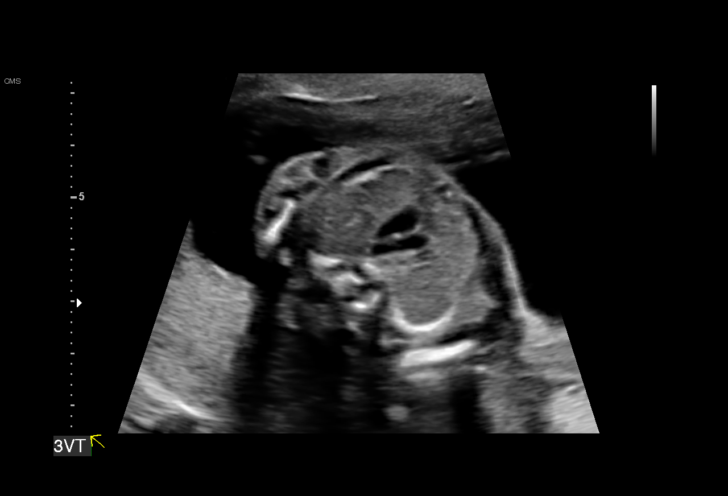
[im 92/113]
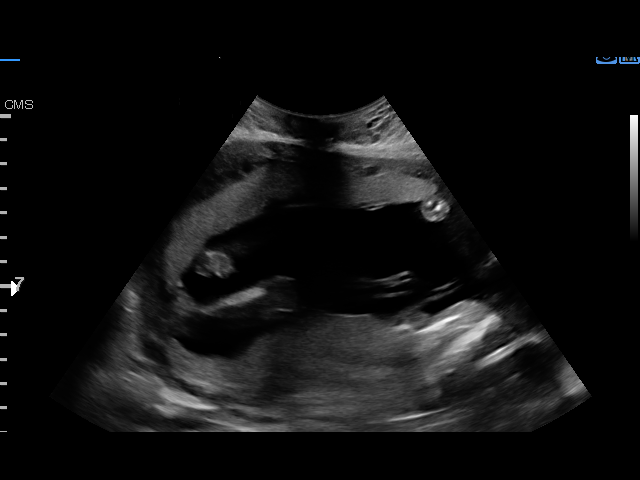
[im 100/113]
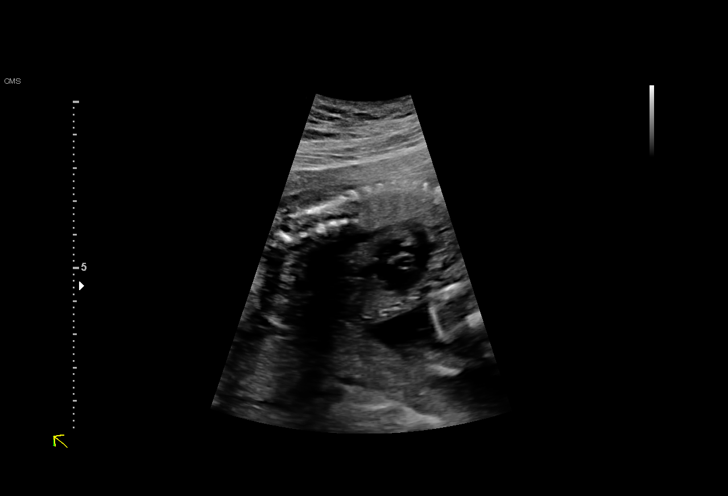
[im 108/113]
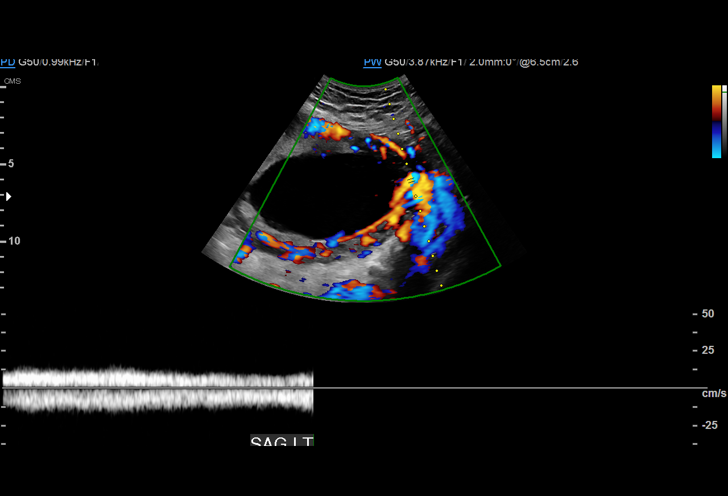

[13 of 28 positions shown; findings below may reference images not displayed]

Ave.,[HOSPITAL]

1  [facility]           [PHONE_NUMBER]      [PHONE_NUMBER]     [PHONE_NUMBER]
2  [facility]              [PHONE_NUMBER]      [PHONE_NUMBER]     [PHONE_NUMBER]
Indications

21 weeks gestation of pregnancy
Encounter for fetal anatomic survey            [F0]
Uterine fibroids affecting pregnancy in        O34.12, [F0]
second trimester, antepartum
Uterine scar, other than C/S (myomectomy       [F0]
[F0])
Prior scan suggested placental abnormality     [F0]
in second trimester (?lakes in office)
OB History

Gravidity:    1         Term:   0        Prem:   0        SAB:   0
TOP:          0       Ectopic:  0        Living: 0
Fetal Evaluation

Num Of Fetuses:     1
Fetal Heart         143
Rate(bpm):
Cardiac Activity:   Observed
Presentation:       Breech
Placenta:           Anterior w/ post succ, above cervical os
P. Cord Insertion:  Not well visualized

Amniotic Fluid
AFI FV:      Subjectively within normal limits

Largest Pocket(cm)
5.28
Biometry

BPD:      53.9  mm     G. Age:  22w 3d         90  %    CI:        76.25   %    70 - 86
FL/HC:      18.9   %    15.9 -
HC:      195.6  mm     G. Age:  21w 5d         67  %    HC/AC:      1.15        1.06 -
AC:      170.7  mm     G. Age:  22w 0d         72  %    FL/BPD:     68.6   %
FL:         37  mm     G. Age:  21w 6d         63  %    FL/AC:      21.7   %    20 - 24
HUM:        34  mm     G. Age:  21w 4d         58  %

Est. FW:     462  gm           1 lb     57  %
Gestational Age

LMP:           21w 1d        Date:  [DATE]                 EDD:   [DATE]
U/S Today:     22w 0d                                        EDD:   [DATE]
Best:          21w 1d     Det. By:  LMP  ([DATE])          EDD:   [DATE]
Anatomy

Cranium:               Appears normal         Aortic Arch:            Appears normal
Cavum:                 Appears normal         Ductal Arch:            Appears normal
Ventricles:            Appears normal         Diaphragm:              Appears normal
Choroid Plexus:        Appears normal         Stomach:                Appears normal, left
sided
Cerebellum:            Appears normal         Abdomen:                Appears normal
Posterior Fossa:       Appears normal         Abdominal Wall:         Appears nml (cord
insert, abd wall)
Nuchal Fold:           Appears normal         Cord Vessels:           Appears normal (3
vessel cord)
Face:                  Appears normal         Kidneys:                Appear normal
(orbits and profile)
Lips:                  Appears normal         Bladder:                Appears normal
Thoracic:              Appears normal         Spine:                  Appears normal
Heart:                 Appears normal         Upper Extremities:      Appears normal
(4CH, axis, and
situs)
RVOT:                  Appears normal         Lower Extremities:      Appears normal
LVOT:                  Appears normal

Other:  Fetus appears to be a female. Heels and 5th digit visualized. Nasal
bone visualized.
Cervix Uterus Adnexa

Cervix
Length:           3.25  cm.
Normal appearance by transabdominal scan.

Uterus
Multiple fibroids noted, see table below.

Left Ovary
Within normal limits.

Right Ovary
Not visualized.

Cul De Sac:   No free fluid seen.

Adnexa:       No abnormality visualized.
Myomas

Site                     L(cm)      W(cm)      D(cm)      Location
Right
Anterior                 2

Blood Flow                 RI        PI       Comments

Impression

Single living intrauterine pregnancy at 21w 1d.
Breech presentation.
Placenta Anterior with posterior succenturiate lobe, no
evidence of previa.
Small placental lakes noted at the fetal surface of the
placenta. No lacunae, abnormal vasculature, or disruption of
the placental-myometrial interface visualized today.
Appropriate fetal growth.
Normal amniotic fluid volume.
The fetal anatomic survey is complete.
Normal fetal anatomy.
No fetal anomalies or soft markers of aneuploidy seen.
The adnexa appear normal bilaterally without masses.
The cervix measures 3.25cm on transabdominal imaging
without funneling.
Uterine fibroids as detailed above.
Recommendations

Recommend follow-up ultrasound 
28 weeks to reassess
placenta given history of myomectomy. This appointment was
arranged today.

## 2017-08-13 ENCOUNTER — Other Ambulatory Visit (HOSPITAL_COMMUNITY): Payer: Self-pay | Admitting: *Deleted

## 2017-08-13 DIAGNOSIS — Z9889 Other specified postprocedural states: Secondary | ICD-10-CM

## 2017-08-16 ENCOUNTER — Encounter (HOSPITAL_COMMUNITY): Payer: BLUE CROSS/BLUE SHIELD

## 2017-08-16 ENCOUNTER — Ambulatory Visit (HOSPITAL_COMMUNITY): Payer: BLUE CROSS/BLUE SHIELD

## 2017-09-28 ENCOUNTER — Other Ambulatory Visit (HOSPITAL_COMMUNITY): Payer: Self-pay | Admitting: Maternal & Fetal Medicine

## 2017-09-28 ENCOUNTER — Encounter (HOSPITAL_COMMUNITY): Payer: Self-pay

## 2017-09-28 ENCOUNTER — Ambulatory Visit (HOSPITAL_COMMUNITY)
Admission: RE | Admit: 2017-09-28 | Discharge: 2017-09-28 | Disposition: A | Discharge status: Home / Self Care | Payer: BLUE CROSS/BLUE SHIELD | Source: Ambulatory Visit | Attending: Obstetrics and Gynecology | Admitting: Obstetrics and Gynecology

## 2017-09-28 DIAGNOSIS — Z9889 Other specified postprocedural states: Secondary | ICD-10-CM

## 2017-09-28 DIAGNOSIS — Z362 Encounter for other antenatal screening follow-up: Secondary | ICD-10-CM | POA: Diagnosis not present

## 2017-09-28 DIAGNOSIS — O3413 Maternal care for benign tumor of corpus uteri, third trimester: Secondary | ICD-10-CM

## 2017-09-28 DIAGNOSIS — D259 Leiomyoma of uterus, unspecified: Secondary | ICD-10-CM | POA: Diagnosis not present

## 2017-09-28 DIAGNOSIS — O3412 Maternal care for benign tumor of corpus uteri, second trimester: Secondary | ICD-10-CM | POA: Diagnosis not present

## 2017-09-28 DIAGNOSIS — Z3A28 28 weeks gestation of pregnancy: Secondary | ICD-10-CM | POA: Diagnosis not present

## 2017-10-08 ENCOUNTER — Other Ambulatory Visit: Payer: Self-pay | Admitting: Obstetrics and Gynecology

## 2017-10-08 ENCOUNTER — Other Ambulatory Visit: Payer: Self-pay

## 2017-10-08 DIAGNOSIS — M7989 Other specified soft tissue disorders: Secondary | ICD-10-CM

## 2017-10-09 ENCOUNTER — Ambulatory Visit (HOSPITAL_COMMUNITY)
Admission: RE | Admit: 2017-10-09 | Discharge: 2017-10-09 | Disposition: A | Discharge status: Home / Self Care | Payer: BLUE CROSS/BLUE SHIELD | Source: Ambulatory Visit | Attending: Vascular Surgery | Admitting: Vascular Surgery

## 2017-10-09 DIAGNOSIS — O26893 Other specified pregnancy related conditions, third trimester: Secondary | ICD-10-CM | POA: Insufficient documentation

## 2017-10-09 DIAGNOSIS — M7989 Other specified soft tissue disorders: Secondary | ICD-10-CM | POA: Insufficient documentation

## 2017-10-09 DIAGNOSIS — Z3A29 29 weeks gestation of pregnancy: Secondary | ICD-10-CM | POA: Insufficient documentation

## 2017-11-12 ENCOUNTER — Other Ambulatory Visit: Payer: Self-pay | Admitting: Obstetrics and Gynecology

## 2017-11-12 ENCOUNTER — Telehealth (HOSPITAL_COMMUNITY): Payer: Self-pay | Admitting: *Deleted

## 2017-11-12 NOTE — Telephone Encounter (Signed)
Preadmission screen  

## 2017-11-13 ENCOUNTER — Telehealth (HOSPITAL_COMMUNITY): Payer: Self-pay | Admitting: *Deleted

## 2017-11-13 ENCOUNTER — Other Ambulatory Visit: Payer: Self-pay | Admitting: Obstetrics and Gynecology

## 2017-11-13 ENCOUNTER — Encounter (HOSPITAL_COMMUNITY): Payer: Self-pay

## 2017-11-13 NOTE — Telephone Encounter (Signed)
Preadmission screen  

## 2017-11-22 ENCOUNTER — Inpatient Hospital Stay (HOSPITAL_COMMUNITY)
Admission: AD | Admit: 2017-11-22 | Discharge: 2017-11-22 | Disposition: A | Discharge status: Home / Self Care | Payer: BLUE CROSS/BLUE SHIELD | Source: Ambulatory Visit | Attending: Obstetrics and Gynecology | Admitting: Obstetrics and Gynecology

## 2017-11-22 DIAGNOSIS — Z3A36 36 weeks gestation of pregnancy: Secondary | ICD-10-CM | POA: Insufficient documentation

## 2017-11-22 DIAGNOSIS — O4703 False labor before 37 completed weeks of gestation, third trimester: Secondary | ICD-10-CM | POA: Diagnosis present

## 2017-11-22 LAB — CBC
HCT: 35.5 % — ABNORMAL LOW (ref 36.0–46.0)
Hemoglobin: 11.8 g/dL — ABNORMAL LOW (ref 12.0–15.0)
MCH: 30.2 pg (ref 26.0–34.0)
MCHC: 33.2 g/dL (ref 30.0–36.0)
MCV: 90.8 fL (ref 78.0–100.0)
PLATELETS: 75 10*3/uL — AB (ref 150–400)
RBC: 3.91 MIL/uL (ref 3.87–5.11)
RDW: 15.8 % — ABNORMAL HIGH (ref 11.5–15.5)
WBC: 5.9 10*3/uL (ref 4.0–10.5)

## 2017-11-22 MED ORDER — BETAMETHASONE SOD PHOS & ACET 6 (3-3) MG/ML IJ SUSP
12.0000 mg | INTRAMUSCULAR | Status: DC
  Administered 2017-11-22: 12 mg via INTRAMUSCULAR
  Filled 2017-11-22: qty 2

## 2017-11-22 NOTE — MAU Note (Signed)
Pt for scheduled c/s next week.  Use of Betamethasone explained. Labs have been drawn. No c/o offered.

## 2017-11-23 ENCOUNTER — Inpatient Hospital Stay (HOSPITAL_COMMUNITY)
Admission: AD | Admit: 2017-11-23 | Discharge: 2017-11-23 | Disposition: A | Discharge status: Home / Self Care | Payer: BLUE CROSS/BLUE SHIELD | Source: Ambulatory Visit | Attending: Obstetrics and Gynecology | Admitting: Obstetrics and Gynecology

## 2017-11-23 DIAGNOSIS — Z029 Encounter for administrative examinations, unspecified: Secondary | ICD-10-CM | POA: Diagnosis not present

## 2017-11-23 MED ORDER — BETAMETHASONE SOD PHOS & ACET 6 (3-3) MG/ML IJ SUSP
12.0000 mg | Freq: Once | INTRAMUSCULAR | Status: AC
  Administered 2017-11-23: 12 mg via INTRAMUSCULAR
  Filled 2017-11-23: qty 2

## 2017-11-26 ENCOUNTER — Encounter (HOSPITAL_COMMUNITY)
Admission: RE | Admit: 2017-11-26 | Discharge: 2017-11-26 | Disposition: A | Discharge status: Home / Self Care | Payer: BLUE CROSS/BLUE SHIELD | Source: Ambulatory Visit | Attending: Obstetrics and Gynecology | Admitting: Obstetrics and Gynecology

## 2017-11-26 ENCOUNTER — Inpatient Hospital Stay (HOSPITAL_COMMUNITY)
Admission: AD | Admit: 2017-11-26 | Discharge: 2017-11-27 | DRG: 787 | Disposition: A | Discharge status: Home / Self Care | Payer: BLUE CROSS/BLUE SHIELD | Source: Ambulatory Visit | Attending: Obstetrics and Gynecology | Admitting: Obstetrics and Gynecology

## 2017-11-26 ENCOUNTER — Inpatient Hospital Stay (HOSPITAL_COMMUNITY): Payer: BLUE CROSS/BLUE SHIELD | Admitting: Anesthesiology

## 2017-11-26 ENCOUNTER — Other Ambulatory Visit: Payer: Self-pay

## 2017-11-26 ENCOUNTER — Encounter (HOSPITAL_COMMUNITY)
Admission: AD | Disposition: A | Discharge status: Home / Self Care | Payer: Self-pay | Source: Ambulatory Visit | Attending: Obstetrics and Gynecology

## 2017-11-26 ENCOUNTER — Encounter (HOSPITAL_COMMUNITY): Payer: Self-pay

## 2017-11-26 ENCOUNTER — Inpatient Hospital Stay (HOSPITAL_COMMUNITY): Payer: BLUE CROSS/BLUE SHIELD

## 2017-11-26 DIAGNOSIS — O368130 Decreased fetal movements, third trimester, not applicable or unspecified: Secondary | ICD-10-CM

## 2017-11-26 DIAGNOSIS — D259 Leiomyoma of uterus, unspecified: Secondary | ICD-10-CM

## 2017-11-26 DIAGNOSIS — O9912 Other diseases of the blood and blood-forming organs and certain disorders involving the immune mechanism complicating childbirth: Secondary | ICD-10-CM | POA: Diagnosis present

## 2017-11-26 DIAGNOSIS — Z3A36 36 weeks gestation of pregnancy: Secondary | ICD-10-CM

## 2017-11-26 DIAGNOSIS — O368190 Decreased fetal movements, unspecified trimester, not applicable or unspecified: Secondary | ICD-10-CM | POA: Diagnosis present

## 2017-11-26 DIAGNOSIS — Z3A37 37 weeks gestation of pregnancy: Secondary | ICD-10-CM | POA: Diagnosis not present

## 2017-11-26 DIAGNOSIS — O3413 Maternal care for benign tumor of corpus uteri, third trimester: Secondary | ICD-10-CM

## 2017-11-26 DIAGNOSIS — D6959 Other secondary thrombocytopenia: Secondary | ICD-10-CM | POA: Diagnosis present

## 2017-11-26 DIAGNOSIS — O3429 Maternal care due to uterine scar from other previous surgery: Secondary | ICD-10-CM

## 2017-11-26 LAB — PREPARE RBC (CROSSMATCH)

## 2017-11-26 LAB — CBC
HCT: 35.5 % — ABNORMAL LOW (ref 36.0–46.0)
Hemoglobin: 11.8 g/dL — ABNORMAL LOW (ref 12.0–15.0)
MCH: 30.3 pg (ref 26.0–34.0)
MCHC: 33.2 g/dL (ref 30.0–36.0)
MCV: 91.3 fL (ref 78.0–100.0)
Platelets: 72 10*3/uL — ABNORMAL LOW (ref 150–400)
RBC: 3.89 MIL/uL (ref 3.87–5.11)
RDW: 15.9 % — ABNORMAL HIGH (ref 11.5–15.5)
WBC: 9.8 10*3/uL (ref 4.0–10.5)

## 2017-11-26 LAB — ABO/RH: ABO/RH(D): O POS

## 2017-11-26 SURGERY — Surgical Case
Anesthesia: Spinal

## 2017-11-26 MED ORDER — LACTATED RINGERS IV SOLN
INTRAVENOUS | Status: DC
  Administered 2017-11-26: 16:00:00 via INTRAVENOUS

## 2017-11-26 MED ORDER — CEFAZOLIN SODIUM-DEXTROSE 2-4 GM/100ML-% IV SOLN: 2.0000 g | INTRAVENOUS | Status: DC

## 2017-11-26 MED ORDER — PRENATAL MULTIVITAMIN CH
1.0000 | ORAL_TABLET | Freq: Every day | ORAL | Status: DC
  Administered 2017-11-27: 1 via ORAL
  Filled 2017-11-26: qty 1

## 2017-11-26 MED ORDER — SOD CITRATE-CITRIC ACID 500-334 MG/5ML PO SOLN
30.0000 mL | Freq: Once | ORAL | Status: DC
  Filled 2017-11-26: qty 15

## 2017-11-26 MED ORDER — DOCUSATE SODIUM 100 MG PO CAPS
100.0000 mg | ORAL_CAPSULE | Freq: Every day | ORAL | Status: DC
  Administered 2017-11-27: 100 mg via ORAL
  Filled 2017-11-26: qty 1

## 2017-11-26 MED ORDER — ACETAMINOPHEN 325 MG PO TABS: 650.0000 mg | ORAL_TABLET | ORAL | Status: DC | PRN

## 2017-11-26 MED ORDER — CALCIUM CARBONATE ANTACID 500 MG PO CHEW: 2.0000 | CHEWABLE_TABLET | ORAL | Status: DC | PRN

## 2017-11-26 MED ORDER — LACTATED RINGERS IV BOLUS
1000.0000 mL | Freq: Once | INTRAVENOUS | Status: AC
  Administered 2017-11-26: 1000 mL via INTRAVENOUS

## 2017-11-26 MED ORDER — ZOLPIDEM TARTRATE 5 MG PO TABS: 5.0000 mg | ORAL_TABLET | Freq: Every evening | ORAL | Status: DC | PRN

## 2017-11-26 MED ORDER — FAMOTIDINE IN NACL 20-0.9 MG/50ML-% IV SOLN
20.0000 mg | Freq: Once | INTRAVENOUS | Status: AC
  Administered 2017-11-26: 20 mg via INTRAVENOUS
  Filled 2017-11-26: qty 50

## 2017-11-26 NOTE — Pre-Procedure Instructions (Signed)
Arrived for PAT instructions given.  C/o decreased fetal movement.  Pt walked to [facility] for evaluation.  Dr Simona Huh aware.  Pt NPO until fetal assessment completed.

## 2017-11-26 NOTE — MAU Note (Signed)
Baby just not moving as much this morning, drank some juice and laid down, has moved a couple times, just not as much. No bleeding, leaking or pain.

## 2017-11-26 NOTE — Anesthesia Preprocedure Evaluation (Deleted)
Anesthesia Evaluation  Patient identified by MRN, date of birth, ID band Patient awake    Reviewed: Allergy & Precautions, NPO status , Patient's Chart, lab work & pertinent test results  Airway Mallampati: II  TM Distance: >3 FB Neck ROM: Full    Dental no notable dental hx. (+) Teeth Intact   Pulmonary neg pulmonary ROS,    Pulmonary exam normal breath sounds clear to auscultation       Cardiovascular negative cardio ROS Normal cardiovascular exam Rhythm:Regular Rate:Normal     Neuro/Psych negative neurological ROS  negative psych ROS   GI/Hepatic Neg liver ROS, GERD  ,  Endo/Other  Obesity  Renal/GU negative Renal ROS  negative genitourinary   Musculoskeletal negative musculoskeletal ROS (+)   Abdominal (+) + obese,   Peds  Hematology  (+) anemia , Thrombocytopenia- thought to be gestational, on day 3 of steroids.   Anesthesia Other Findings   Reproductive/Obstetrics (+) Pregnancy 36 weeks Decreased fetal movement Hx/o myomectomy with ? Placenta accreta early in pregnancy                            Anesthesia Physical Anesthesia Plan  ASA: II and emergent  Anesthesia Plan: Spinal   Post-op Pain Management:    Induction:   PONV Risk Score and Plan: 3 and Ondansetron, Dexamethasone, Treatment may vary due to age or medical condition and Scopolamine patch - Pre-op  Airway Management Planned: Natural Airway  Additional Equipment:   Intra-op Plan:   Post-operative Plan:   Informed Consent: I have reviewed the patients History and Physical, chart, labs and discussed the procedure including the risks, benefits and alternatives for the proposed anesthesia with the patient or authorized representative who has indicated his/her understanding and acceptance.   Dental advisory given  Plan Discussed with: CRNA and Surgeon  Anesthesia Plan Comments: (Will do SAB. Risks, benefits  and alternatives explained to patient in detail including bleeding, epidural hematoma, need for transfusion of blood or blood products, infection and hypotension. Patient understands and questions were answered.)       Anesthesia Quick Evaluation

## 2017-11-26 NOTE — Patient Instructions (Signed)
Denise Castillo  11/26/2017   Your procedure is scheduled on:  11/28/2017  Enter through the Main Entrance of [facility] at [place] up the phone at the desk and dial [phone]  Call this number if you have problems the morning of surgery:[phone]  Remember:   Do not eat food:(After Midnight) Desps de medianoche.  Do not drink clear liquids: (After Midnight) Desps de medianoche.  Take these medicines the morning of surgery with A SIP OF WATER: none   Do not wear jewelry, make-up or nail polish.  Do not wear lotions, powders, or perfumes. Do not wear deodorant.  Do not shave 48 hours prior to surgery.  Do not bring valuables to the hospital.  [facility] is not   responsible for any belongings or valuables brought to the hospital.  Contacts, dentures or bridgework may not be worn into surgery.  Leave suitcase in the car. After surgery it may be brought to your room.  For patients admitted to the hospital, checkout time is 11:00 AM the day of              discharge.    N/A   Please read over the following fact sheets that you were given:   Surgical Site Infection Prevention

## 2017-11-26 NOTE — MAU Provider Note (Signed)
History   [id number]   Chief Complaint  Patient presents with  . Decreased Fetal Movement    HPI Denise Castillo is a 32 y.o. female  G1P0000 here with report of decreased fetal movement since yesterday.  Reports feeling the baby move approximately 4 times in the past 24 hour.  Denies vaginal bleeding or leaking of fluid. Denies abdominal pain.  Patient's last menstrual period was 03/15/2017.  OB History  Gravida Para Term Preterm AB Living  1 0 0 0 0 0  SAB TAB Ectopic Multiple Live Births  0 0 0 0      # Outcome Date GA Lbr Len/2nd Weight Sex Delivery Anes PTL Lv  1 Current             Past Medical History:  Diagnosis Date  . Iron deficiency anemia   . Seasonal allergies   . Uterine fibroid   . Wears contact lenses     Family History  Problem Relation Age of Onset  . Hypertension Mother   . Hypertension Maternal Aunt   . Hypertension Maternal Uncle   . Hypertension Maternal Grandmother   . Hypertension Maternal Grandfather     Social History   Socioeconomic History  . Marital status: Married    Spouse name: Not on file  . Number of children: Not on file  . Years of education: Not on file  . Highest education level: Not on file  Occupational History  . Not on file  Social Needs  . Financial resource strain: Not on file  . Food insecurity:    Worry: Not on file    Inability: Not on file  . Transportation needs:    Medical: Not on file    Non-medical: Not on file  Tobacco Use  . Smoking status: Never Smoker  . Smokeless tobacco: Never Used  Substance and Sexual Activity  . Alcohol use: No  . Drug use: No  . Sexual activity: Not Currently    Partners: Male    Birth control/protection: None  Lifestyle  . Physical activity:    Days per week: Not on file    Minutes per session: Not on file  . Stress: Not on file  Relationships  . Social connections:    Talks on phone: Not on file    Gets together: Not on file    Attends religious service: Not on  file    Active member of club or organization: Not on file    Attends meetings of clubs or organizations: Not on file    Relationship status: Not on file  Other Topics Concern  . Not on file  Social History Narrative  . Not on file    No Known Allergies  No current facility-administered medications on file prior to encounter.    Current Outpatient Medications on File Prior to Encounter  Medication Sig Dispense Refill  . docusate sodium (COLACE) 50 MG capsule Take 50 mg by mouth daily as needed for mild constipation.    . Fe Fum-FePoly-Vit C-Vit B3 (INTEGRA PO) Take 1 tablet by mouth daily.    . Prenat-FeFmCb-DSS-FA-DHA w/o A (CITRANATAL HARMONY) 27-1-260 MG CAPS Take 1 tablet by mouth daily after lunch.    Addison Lank Hazel (PREPARATION H EX) Apply 1 application topically daily as needed (hemorrhoids).       Review of Systems  Constitutional: Negative.   Gastrointestinal: Negative.   Genitourinary: Negative.    Physical Exam   Vitals:   11/26/17 1058  BP: 121/70  Pulse: 86  Resp: 16  Temp: 98.4 F (36.9 C)  TempSrc: Oral  SpO2: 99%  Weight: 188 lb 4 oz (85.4 kg)    Physical Exam  Nursing note and vitals reviewed. Constitutional: She is oriented to person, place, and time. She appears well-developed and well-nourished. No distress.  HENT:  Head: Normocephalic and atraumatic.  Eyes: Conjunctivae are normal. Right eye exhibits no discharge. Left eye exhibits no discharge. No scleral icterus.  Neck: Normal range of motion.  Respiratory: Effort normal. No respiratory distress.  GI: Soft. There is no tenderness.  Neurological: She is alert and oriented to person, place, and time.  Skin: Skin is warm and dry. She is not diaphoretic.  Psychiatric: She has a normal mood and affect. Her behavior is normal. Judgment and thought content normal.    MAU Course  Procedures No results found for this or any previous visit (from the past 24 hour(s)).  MDM NST:  Baseline: 140  bpm, Variability: Good {> 6 bpm), Accelerations: 10x10 and Decelerations: Absent Category 1 tracing, but not reactive. Could hear fetal movement on monitor but patient reports not feeling it. BPP ordered & is 8/8 with AFI of 16  C/w Dr. Simona Huh regarding fetal tracing and BPP.   Assessment and Plan  A: 1. [redacted] weeks gestation of pregnancy   2. Decreased fetal movement affecting management of pregnancy in third trimester, single or unspecified fetus    P: Dr. Simona Huh on unit and assumes care of the patient    Jorje Guild, NP 11/26/2017 11:29 AM

## 2017-11-27 ENCOUNTER — Encounter (HOSPITAL_COMMUNITY)
Admission: RE | Admit: 2017-11-27 | Discharge: 2017-11-27 | Disposition: A | Discharge status: Home / Self Care | Payer: BLUE CROSS/BLUE SHIELD | Source: Ambulatory Visit | Attending: Obstetrics and Gynecology | Admitting: Obstetrics and Gynecology

## 2017-11-27 ENCOUNTER — Inpatient Hospital Stay (HOSPITAL_COMMUNITY): Payer: BLUE CROSS/BLUE SHIELD

## 2017-11-27 LAB — BPAM RBC
Blood Product Expiration Date: 201907232359
Blood Product Expiration Date: 201907232359
ISSUE DATE / TIME: 201906251521
ISSUE DATE / TIME: 201906251521
UNIT TYPE AND RH: 5100
Unit Type and Rh: 5100

## 2017-11-27 LAB — TYPE AND SCREEN
ABO/RH(D): O POS
ANTIBODY SCREEN: NEGATIVE
Unit division: 0
Unit division: 0

## 2017-11-27 MED ORDER — DEXAMETHASONE 4 MG PO TABS: 40.0000 mg | ORAL_TABLET | Freq: Every day | ORAL | 0 refills | Status: DC

## 2017-11-27 MED ORDER — DEXAMETHASONE SODIUM PHOSPHATE 10 MG/ML IJ SOLN
40.0000 mg | INTRAMUSCULAR | Status: DC
  Administered 2017-11-27: 40 mg via INTRAVENOUS
  Filled 2017-11-27: qty 4

## 2017-11-27 NOTE — Progress Notes (Signed)
Discharge teaching complete with pt. Pt understood all information and did not have any questions. Pt pushed via wheelchair and discharged home to family.

## 2017-11-27 NOTE — H&P (Signed)
Denise Castillo is a 32 y.o. female G1 @ 36 4/7 weeks c/w 7 week ultrasound being admitted for observation due to decreased fetal movement. Pt presented today for a preop appt for primary c-section scheduled on 11/29/17 when she reported decreased fetal movement.  Pt reports movement has slowly reduced in the past 4-5 days.  Pt had less then 10 kicks  Yesterday and then less than 3 this morning.  This is a significant change from previous.   Pt denies contractions, LOF, vaginal bleeding, mucus plug. Prenatal care with [facility] Simona Huh) complicated by Gestational Thrombocytopenia.  Pt has taken Dexamethasone x 3 days to help improve platelet count in anticipation of primary caesarean section due to h/o Myomectomy.  Pt also received BMZ for preterm delivery.  OB History    Gravida  1   Para  0   Term  0   Preterm  0   AB  0   Living  0     SAB  0   TAB  0   Ectopic  0   Multiple  0   Live Births             Past Medical History:  Diagnosis Date  . Iron deficiency anemia   . Seasonal allergies   . Uterine fibroid   . Wears contact lenses    Past Surgical History:  Procedure Laterality Date  . LAPAROSCOPIC GELPORT ASSISTED MYOMECTOMY N/A 12/14/2015   Procedure: LAPAROSCOPIC GELPORT ASSISTED MYOMECTOMY;  Surgeon: Governor Specking, MD;  Location: [place];  Service: Gynecology;  Laterality: N/A;  . WRIST SURGERY     Family History: family history includes Hypertension in her maternal aunt, maternal grandfather, maternal grandmother, maternal uncle, and mother. Social History:  reports that she has never smoked. She has never used smokeless tobacco. She reports that she does not drink alcohol or use drugs.     Maternal Diabetes: No Genetic Screening: Normal Maternal Ultrasounds/Referrals: Normal Fetal Ultrasounds or other Referrals:  None Maternal Substance Abuse:  No Significant Maternal Medications:  Meds include: Other: BMZ,  Dexamethsone Significant Maternal Lab Results:  None Other Comments:  Gestational Thrombocytopenia.  ROS History   Blood pressure 109/63, pulse 63, temperature 97.9 F (36.6 C), temperature source Oral, resp. rate 18, height 5\' 4"  (1.626 m), weight 85.4 kg (188 lb 4 oz), last menstrual period 03/15/2017, SpO2 95 %. Exam Physical Exam  Gen: NAD Abd:  No abdominal pain over uterus, questionable defect in uterus Ext: Edema bilaterally, no calf tenderness Cervix deferred.  Prenatal labs: ABO, Rh: --/--/O POS, O POS Performed at [facility], [address], [place], [place] [address]  [phone] [id number]/24 1422) Antibody: NEG (06/24 1422) Rubella: Immune (11/30 0000) RPR: Nonreactive (11/30 0000)  HBsAg: Negative (11/30 0000)  HIV: Non-reactive (11/30 0000)  GBS:     Assessment/Plan: IUP @ 36 4/7 weeks Decreased Fetal movement. Nonreactive tracing, BPP 8/10 Gestational Thrombocytopenia H/o Myomectomy  Desire to proceed with primary c-section due to fetal status and 48 hours from scheduled c-section.  Pt is also s/p BMZ.  Due to ACOG guidelines and concerns by [facility] staff for delivery less than 37 weeks, pt admitted for observation. Continuous fetal monitoring.  Proceed with delivery if fetal status worsens. Plan to repeat BPP in am.   Thurnell Lose 11/27/2017, 12:45 AM

## 2017-11-27 NOTE — Discharge Instructions (Signed)

## 2017-11-27 NOTE — Discharge Summary (Signed)
Physician Discharge Summary  Patient ID: Denise Castillo MRN: [id number] DOB/AGE: Jun 09, 1985 32 y.o.  Admit date: 11/26/2017 Discharge date: 11/27/2017  Admission Diagnoses:IUP @ 36 4/7 weeks, H/o Myomectomy, Gestational Thrombocytopenia  Discharge Diagnoses:  Active Problems:   Decreased fetal movement   Discharged Condition: Fetal status improved, NST reassuring  Hospital Course: Pt was admitted for observation due to decreased fetal movement and Nonreactive tracing.  BPP 8/10.    Consults: MFM-phone consult Dr. Nelda Severe  Significant Diagnostic Studies: BPP, Prolonged fetal monitoring  Treatments: None  Discharge Exam: Blood pressure 110/64, pulse 60, temperature 98.2 F (36.8 C), temperature source Oral, resp. rate 18, height 5\' 4"  (1.626 m), weight 85.4 kg (188 lb 4 oz), last menstrual period 03/15/2017, SpO2 98 %.  Pt reports fetal movement significantly improved.  Gen:  nad Abd:  Soft, nontender. Ext: Swelling unchanged, no calf tenderness  Fetal tracing reactive.  Disposition: Discharge disposition: 01-Home or Self Care       Discharge Instructions    Discharge activity:  No Restrictions   Complete by:  As directed    Discharge diet:  No restrictions   Complete by:  As directed    Fetal Kick Count:  Lie on our left side for one hour after a meal, and count the number of times your baby kicks.  If it is less than 5 times, get up, move around and drink some juice.  Repeat the test 30 minutes later.  If it is still less than 5 kicks in an hour, notify your doctor.   Complete by:  As directed    LABOR:  When conractions begin, you should start to time them from the beginning of one contraction to the beginning  of the next.  When contractions are 5 - 10 minutes apart or less and have been regular for at least an hour, you should call your health care provider.   Complete by:  As directed    No sexual activity restrictions   Complete by:  As directed    Notify  physician for bleeding from the vagina   Complete by:  As directed    Notify physician for blurring of vision or spots before the eyes   Complete by:  As directed    Notify physician for chills or fever   Complete by:  As directed    Notify physician for fainting spells, "black outs" or loss of consciousness   Complete by:  As directed    Notify physician for increase in vaginal discharge   Complete by:  As directed    Notify physician for leaking of fluid   Complete by:  As directed    Notify physician for pain or burning when urinating   Complete by:  As directed    Notify physician for pelvic pressure (sudden increase)   Complete by:  As directed    Notify physician for severe or continued nausea or vomiting   Complete by:  As directed    Notify physician for sudden gushing of fluid from the vagina (with or without continued leaking)   Complete by:  As directed    Notify physician for sudden, constant, or occasional abdominal pain   Complete by:  As directed    Notify physician if baby moving less than usual   Complete by:  As directed      Allergies as of 11/27/2017   No Known Allergies     Medication List    TAKE these medications   CITRANATAL  HARMONY 27-1-260 MG Caps Take 1 tablet by mouth daily after lunch.   dexamethasone 4 MG tablet Commonly known as:  DECADRON Take 10 tablets (40 mg total) by mouth daily. For 4 days. Started 11/24/17, ending 11/28/17   docusate sodium 50 MG capsule Commonly known as:  COLACE Take 50 mg by mouth daily as needed for mild constipation.   INTEGRA PO Take 1 tablet by mouth daily.      Follow-up [facility] Follow up on 11/30/2017.   Why:  C-section Contact information: [place] [address] [phone]          Signed: Thurnell Lose 11/27/2017, 2:48 PM

## 2017-11-27 NOTE — Progress Notes (Signed)
Pt off monitor in WC to MFM for [name] with NT and husband

## 2017-11-28 ENCOUNTER — Telehealth (HOSPITAL_COMMUNITY): Payer: Self-pay | Admitting: *Deleted

## 2017-11-28 ENCOUNTER — Inpatient Hospital Stay (HOSPITAL_COMMUNITY)
Admission: AD | Admit: 2017-11-28 | Payer: BLUE CROSS/BLUE SHIELD | Source: Ambulatory Visit | Admitting: Obstetrics and Gynecology

## 2017-11-28 NOTE — Telephone Encounter (Signed)
Pt is scheduled for a CS on Friday.  Due to having a PAT visit last week and having skin prep and instructions at home no PAT visit will be done day before surgery.  Pt also has to have labs drawn on day of surgery due to thrombocytopenia.  Spoke with pt and reviewed arrival time of 0730 and to check in with Admitting prior to going to L&D.  Verbalized understanding

## 2017-11-29 ENCOUNTER — Encounter (HOSPITAL_COMMUNITY): Admission: RE | Admit: 2017-11-29 | Payer: BLUE CROSS/BLUE SHIELD | Source: Ambulatory Visit

## 2017-11-30 ENCOUNTER — Encounter (HOSPITAL_COMMUNITY)
Admission: RE | Disposition: A | Discharge status: Home / Self Care | Payer: Self-pay | Source: Ambulatory Visit | Attending: Obstetrics and Gynecology

## 2017-11-30 ENCOUNTER — Inpatient Hospital Stay (HOSPITAL_COMMUNITY)
Admission: RE | Admit: 2017-11-30 | Discharge: 2017-12-03 | Disposition: A | Discharge status: Home / Self Care | Payer: BLUE CROSS/BLUE SHIELD | Source: Ambulatory Visit | Attending: Obstetrics and Gynecology | Admitting: Obstetrics and Gynecology

## 2017-11-30 ENCOUNTER — Other Ambulatory Visit: Payer: Self-pay

## 2017-11-30 ENCOUNTER — Inpatient Hospital Stay (HOSPITAL_COMMUNITY): Payer: BLUE CROSS/BLUE SHIELD | Admitting: Anesthesiology

## 2017-11-30 ENCOUNTER — Encounter (HOSPITAL_COMMUNITY): Payer: Self-pay | Admitting: *Deleted

## 2017-11-30 DIAGNOSIS — Z98891 History of uterine scar from previous surgery: Secondary | ICD-10-CM

## 2017-11-30 HISTORY — DX: History of uterine scar from previous surgery: Z98.891

## 2017-11-30 LAB — CBC
HEMATOCRIT: 36.5 % (ref 36.0–46.0)
Hemoglobin: 12.5 g/dL (ref 12.0–15.0)
MCH: 30.6 pg (ref 26.0–34.0)
MCHC: 34.2 g/dL (ref 30.0–36.0)
MCV: 89.2 fL (ref 78.0–100.0)
Platelets: 76 10*3/uL — ABNORMAL LOW (ref 150–400)
RBC: 4.09 MIL/uL (ref 3.87–5.11)
RDW: 15.1 % (ref 11.5–15.5)
WBC: 7.4 10*3/uL (ref 4.0–10.5)

## 2017-11-30 LAB — PREPARE RBC (CROSSMATCH)

## 2017-11-30 SURGERY — Surgical Case
Anesthesia: Spinal | Site: Abdomen | Wound class: Clean Contaminated

## 2017-11-30 MED ORDER — COCONUT OIL OIL: 1.0000 "application " | TOPICAL_OIL | Status: DC | PRN

## 2017-11-30 MED ORDER — FENTANYL CITRATE (PF) 100 MCG/2ML IJ SOLN
INTRAMUSCULAR | Status: AC
  Filled 2017-11-30: qty 2

## 2017-11-30 MED ORDER — MORPHINE SULFATE (PF) 0.5 MG/ML IJ SOLN
INTRAMUSCULAR | Status: AC
  Filled 2017-11-30: qty 10

## 2017-11-30 MED ORDER — OXYCODONE HCL 5 MG PO TABS: 5.0000 mg | ORAL_TABLET | Freq: Once | ORAL | Status: DC | PRN

## 2017-11-30 MED ORDER — OXYCODONE HCL 5 MG/5ML PO SOLN: 5.0000 mg | Freq: Once | ORAL | Status: DC | PRN

## 2017-11-30 MED ORDER — WITCH HAZEL-GLYCERIN EX PADS: 1.0000 "application " | MEDICATED_PAD | CUTANEOUS | Status: DC | PRN

## 2017-11-30 MED ORDER — PRENATAL MULTIVITAMIN CH
1.0000 | ORAL_TABLET | Freq: Every day | ORAL | Status: DC
  Administered 2017-12-01 – 2017-12-03 (×3): 1 via ORAL
  Filled 2017-11-30 (×4): qty 1

## 2017-11-30 MED ORDER — LACTATED RINGERS IV SOLN
INTRAVENOUS | Status: DC
  Administered 2017-12-01: 06:00:00 via INTRAVENOUS

## 2017-11-30 MED ORDER — SCOPOLAMINE 1 MG/3DAYS TD PT72
1.0000 | MEDICATED_PATCH | Freq: Once | TRANSDERMAL | Status: DC
  Administered 2017-11-30: 1.5 mg via TRANSDERMAL
  Filled 2017-11-30: qty 1

## 2017-11-30 MED ORDER — SIMETHICONE 80 MG PO CHEW
80.0000 mg | CHEWABLE_TABLET | Freq: Three times a day (TID) | ORAL | Status: DC
  Administered 2017-11-30 – 2017-12-03 (×7): 80 mg via ORAL
  Filled 2017-11-30 (×7): qty 1

## 2017-11-30 MED ORDER — ACETAMINOPHEN 10 MG/ML IV SOLN: 1000.0000 mg | Freq: Once | INTRAVENOUS | Status: DC | PRN

## 2017-11-30 MED ORDER — FENTANYL CITRATE (PF) 100 MCG/2ML IJ SOLN
INTRAMUSCULAR | Status: DC | PRN
  Administered 2017-11-30: 10 ug via INTRATHECAL

## 2017-11-30 MED ORDER — PROMETHAZINE HCL 25 MG/ML IJ SOLN: 6.2500 mg | INTRAMUSCULAR | Status: DC | PRN

## 2017-11-30 MED ORDER — OXYTOCIN 10 UNIT/ML IJ SOLN
INTRAMUSCULAR | Status: AC
  Filled 2017-11-30: qty 4

## 2017-11-30 MED ORDER — TETANUS-DIPHTH-ACELL PERTUSSIS 5-2.5-18.5 LF-MCG/0.5 IM SUSP: 0.5000 mL | Freq: Once | INTRAMUSCULAR | Status: DC

## 2017-11-30 MED ORDER — LACTATED RINGERS IV SOLN
INTRAVENOUS | Status: DC
  Administered 2017-11-30 (×2): via INTRAVENOUS

## 2017-11-30 MED ORDER — OXYTOCIN 40 UNITS IN LACTATED RINGERS INFUSION - SIMPLE MED
2.5000 [IU]/h | INTRAVENOUS | Status: AC
  Administered 2017-11-30: 2.5 [IU]/h via INTRAVENOUS
  Filled 2017-11-30: qty 1000

## 2017-11-30 MED ORDER — CEFAZOLIN SODIUM-DEXTROSE 2-4 GM/100ML-% IV SOLN
2.0000 g | INTRAVENOUS | Status: AC
  Administered 2017-11-30: 2 g via INTRAVENOUS
  Filled 2017-11-30: qty 100

## 2017-11-30 MED ORDER — METOCLOPRAMIDE HCL 5 MG/ML IJ SOLN
INTRAMUSCULAR | Status: AC
  Filled 2017-11-30: qty 2

## 2017-11-30 MED ORDER — MORPHINE SULFATE (PF) 0.5 MG/ML IJ SOLN
INTRAMUSCULAR | Status: DC | PRN
  Administered 2017-11-30: 200 ug via INTRATHECAL

## 2017-11-30 MED ORDER — MEPERIDINE HCL 25 MG/ML IJ SOLN
INTRAMUSCULAR | Status: AC
  Filled 2017-11-30: qty 1

## 2017-11-30 MED ORDER — SIMETHICONE 80 MG PO CHEW
80.0000 mg | CHEWABLE_TABLET | ORAL | Status: DC
  Administered 2017-12-01 – 2017-12-02 (×3): 80 mg via ORAL
  Filled 2017-11-30 (×3): qty 1

## 2017-11-30 MED ORDER — PHENYLEPHRINE 8 MG IN D5W 100 ML (0.08MG/ML) PREMIX OPTIME
INJECTION | INTRAVENOUS | Status: AC
  Filled 2017-11-30: qty 100

## 2017-11-30 MED ORDER — SODIUM CHLORIDE 0.9% IV SOLUTION: Freq: Once | INTRAVENOUS | Status: DC

## 2017-11-30 MED ORDER — ONDANSETRON HCL 4 MG/2ML IJ SOLN
INTRAMUSCULAR | Status: AC
  Filled 2017-11-30: qty 2

## 2017-11-30 MED ORDER — BUPIVACAINE IN DEXTROSE 0.75-8.25 % IT SOLN
INTRATHECAL | Status: DC | PRN
  Administered 2017-11-30: 1.5 mL via INTRATHECAL

## 2017-11-30 MED ORDER — SODIUM CHLORIDE 0.9 % IR SOLN
Status: DC | PRN
  Administered 2017-11-30: 1

## 2017-11-30 MED ORDER — MISOPROSTOL 200 MCG PO TABS
200.0000 ug | ORAL_TABLET | Freq: Four times a day (QID) | ORAL | Status: AC
  Administered 2017-11-30 – 2017-12-01 (×3): 200 ug via ORAL
  Filled 2017-11-30 (×4): qty 1

## 2017-11-30 MED ORDER — ONDANSETRON HCL 4 MG/2ML IJ SOLN
INTRAMUSCULAR | Status: DC | PRN
  Administered 2017-11-30: 4 mg via INTRAVENOUS

## 2017-11-30 MED ORDER — LIDOCAINE HCL 1 % IJ SOLN
INTRAMUSCULAR | Status: DC | PRN
  Administered 2017-11-30: 15 mL

## 2017-11-30 MED ORDER — LACTATED RINGERS IV SOLN: INTRAVENOUS | Status: DC | PRN

## 2017-11-30 MED ORDER — SOD CITRATE-CITRIC ACID 500-334 MG/5ML PO SOLN
30.0000 mL | Freq: Once | ORAL | Status: AC
  Administered 2017-11-30: 30 mL via ORAL
  Filled 2017-11-30: qty 15

## 2017-11-30 MED ORDER — HYDROMORPHONE HCL 1 MG/ML IJ SOLN
INTRAMUSCULAR | Status: AC
  Filled 2017-11-30: qty 0.5

## 2017-11-30 MED ORDER — MENTHOL 3 MG MT LOZG: 1.0000 | LOZENGE | OROMUCOSAL | Status: DC | PRN

## 2017-11-30 MED ORDER — ACETAMINOPHEN 325 MG PO TABS
650.0000 mg | ORAL_TABLET | ORAL | Status: DC | PRN
  Administered 2017-12-01 – 2017-12-03 (×7): 650 mg via ORAL
  Filled 2017-11-30 (×7): qty 2

## 2017-11-30 MED ORDER — PHENYLEPHRINE 40 MCG/ML (10ML) SYRINGE FOR IV PUSH (FOR BLOOD PRESSURE SUPPORT)
PREFILLED_SYRINGE | INTRAVENOUS | Status: AC
  Filled 2017-11-30: qty 10

## 2017-11-30 MED ORDER — DIBUCAINE 1 % RE OINT: 1.0000 "application " | TOPICAL_OINTMENT | RECTAL | Status: DC | PRN

## 2017-11-30 MED ORDER — DEXAMETHASONE SODIUM PHOSPHATE 4 MG/ML IJ SOLN
INTRAMUSCULAR | Status: AC
  Filled 2017-11-30: qty 1

## 2017-11-30 MED ORDER — OXYCODONE HCL 5 MG PO TABS: 10.0000 mg | ORAL_TABLET | ORAL | Status: DC | PRN

## 2017-11-30 MED ORDER — IBUPROFEN 600 MG PO TABS: 600.0000 mg | ORAL_TABLET | Freq: Four times a day (QID) | ORAL | Status: DC

## 2017-11-30 MED ORDER — TRIAMCINOLONE ACETONIDE 40 MG/ML IJ SUSP
INTRAMUSCULAR | Status: DC | PRN
  Administered 2017-11-30: 40 mg via INTRADERMAL

## 2017-11-30 MED ORDER — MEPERIDINE HCL 25 MG/ML IJ SOLN
INTRAMUSCULAR | Status: DC | PRN
  Administered 2017-11-30 (×2): 12.5 mg via INTRAVENOUS

## 2017-11-30 MED ORDER — OXYCODONE HCL 5 MG PO TABS
5.0000 mg | ORAL_TABLET | ORAL | Status: DC | PRN
  Administered 2017-12-01 – 2017-12-03 (×5): 5 mg via ORAL
  Filled 2017-11-30 (×5): qty 1

## 2017-11-30 MED ORDER — DIPHENHYDRAMINE HCL 25 MG PO CAPS
25.0000 mg | ORAL_CAPSULE | Freq: Four times a day (QID) | ORAL | Status: DC | PRN
  Administered 2017-12-01: 25 mg via ORAL
  Filled 2017-11-30: qty 1

## 2017-11-30 MED ORDER — OXYTOCIN 10 UNIT/ML IJ SOLN
INTRAVENOUS | Status: DC | PRN
  Administered 2017-11-30: 40 [IU] via INTRAVENOUS

## 2017-11-30 MED ORDER — ZOLPIDEM TARTRATE 5 MG PO TABS: 5.0000 mg | ORAL_TABLET | Freq: Every evening | ORAL | Status: DC | PRN

## 2017-11-30 MED ORDER — DEXAMETHASONE SODIUM PHOSPHATE 4 MG/ML IJ SOLN
INTRAMUSCULAR | Status: DC | PRN
  Administered 2017-11-30: 4 mg via INTRAVENOUS

## 2017-11-30 MED ORDER — METOCLOPRAMIDE HCL 5 MG/ML IJ SOLN
INTRAMUSCULAR | Status: DC | PRN
  Administered 2017-11-30: 10 mg via INTRAVENOUS

## 2017-11-30 MED ORDER — LIDOCAINE HCL 1 % IJ SOLN
INTRAMUSCULAR | Status: AC
  Filled 2017-11-30: qty 20

## 2017-11-30 MED ORDER — SENNOSIDES-DOCUSATE SODIUM 8.6-50 MG PO TABS
2.0000 | ORAL_TABLET | ORAL | Status: DC
  Administered 2017-12-01 – 2017-12-02 (×3): 2 via ORAL
  Filled 2017-11-30 (×3): qty 2

## 2017-11-30 MED ORDER — LACTATED RINGERS IV SOLN: INTRAVENOUS | Status: DC

## 2017-11-30 MED ORDER — METHYLERGONOVINE MALEATE 0.2 MG/ML IJ SOLN: 0.2000 mg | INTRAMUSCULAR | Status: DC | PRN

## 2017-11-30 MED ORDER — METHYLERGONOVINE MALEATE 0.2 MG PO TABS: 0.2000 mg | ORAL_TABLET | ORAL | Status: DC | PRN

## 2017-11-30 MED ORDER — TRIAMCINOLONE ACETONIDE 40 MG/ML IJ SUSP
40.0000 mg | Freq: Once | INTRAMUSCULAR | Status: DC
  Filled 2017-11-30: qty 1

## 2017-11-30 MED ORDER — SIMETHICONE 80 MG PO CHEW: 80.0000 mg | CHEWABLE_TABLET | ORAL | Status: DC | PRN

## 2017-11-30 MED ORDER — PHENYLEPHRINE 8 MG IN D5W 100 ML (0.08MG/ML) PREMIX OPTIME
INJECTION | INTRAVENOUS | Status: DC | PRN
  Administered 2017-11-30: 60 ug/min via INTRAVENOUS

## 2017-11-30 MED ORDER — LACTATED RINGERS IV SOLN
INTRAVENOUS | Status: DC | PRN
  Administered 2017-11-30: 10:00:00 via INTRAVENOUS

## 2017-11-30 MED ORDER — HYDROMORPHONE HCL 1 MG/ML IJ SOLN
0.2500 mg | INTRAMUSCULAR | Status: DC | PRN
  Administered 2017-11-30: 0.25 mg via INTRAVENOUS

## 2017-11-30 MED ORDER — CEFAZOLIN SODIUM-DEXTROSE 2-4 GM/100ML-% IV SOLN: 2.0000 g | INTRAVENOUS | Status: DC

## 2017-11-30 SURGICAL SUPPLY — 39 items
BARRIER ADHS 3X4 INTERCEED (GAUZE/BANDAGES/DRESSINGS) ×3 IMPLANT
BENZOIN TINCTURE PRP APPL 2/3 (GAUZE/BANDAGES/DRESSINGS) ×3 IMPLANT
CHLORAPREP W/TINT 26ML (MISCELLANEOUS) ×3 IMPLANT
CLAMP CORD UMBIL (MISCELLANEOUS) IMPLANT
CLOSURE STERI STRIP 1/2 X4 (GAUZE/BANDAGES/DRESSINGS) ×3 IMPLANT
CLOSURE WOUND 1/2 X4 (GAUZE/BANDAGES/DRESSINGS)
CLOTH BEACON ORANGE TIMEOUT ST (SAFETY) ×3 IMPLANT
DERMABOND ADVANCED (GAUZE/BANDAGES/DRESSINGS)
DERMABOND ADVANCED .7 DNX12 (GAUZE/BANDAGES/DRESSINGS) IMPLANT
DRSG OPSITE POSTOP 4X10 (GAUZE/BANDAGES/DRESSINGS) ×3 IMPLANT
ELECT REM PT RETURN 9FT ADLT (ELECTROSURGICAL) ×3
ELECTRODE REM PT RTRN 9FT ADLT (ELECTROSURGICAL) ×1 IMPLANT
EXTRACTOR VACUUM BELL STYLE (SUCTIONS) ×3 IMPLANT
GAUZE SPONGE 4X4 12PLY STRL LF (GAUZE/BANDAGES/DRESSINGS) ×3 IMPLANT
GLOVE BIO SURGEON STRL SZ7 (GLOVE) ×3 IMPLANT
GLOVE BIOGEL PI IND STRL 7.0 (GLOVE) ×2 IMPLANT
GLOVE BIOGEL PI INDICATOR 7.0 (GLOVE) ×4
GOWN STRL REUS W/TWL LRG LVL3 (GOWN DISPOSABLE) ×6 IMPLANT
KIT ABG SYR 3ML LUER SLIP (SYRINGE) IMPLANT
NEEDLE HYPO 22GX1.5 SAFETY (NEEDLE) ×3 IMPLANT
NEEDLE HYPO 25X5/8 SAFETYGLIDE (NEEDLE) IMPLANT
NS IRRIG 1000ML POUR BTL (IV SOLUTION) ×3 IMPLANT
PACK C SECTION WH (CUSTOM PROCEDURE TRAY) ×3 IMPLANT
PAD ABD 7.5X8 STRL (GAUZE/BANDAGES/DRESSINGS) ×3 IMPLANT
PAD OB MATERNITY 4.3X12.25 (PERSONAL CARE ITEMS) ×3 IMPLANT
PENCIL SMOKE EVAC W/HOLSTER (ELECTROSURGICAL) ×3 IMPLANT
RTRCTR C-SECT PINK 25CM LRG (MISCELLANEOUS) ×3 IMPLANT
STRIP CLOSURE SKIN 1/2X4 (GAUZE/BANDAGES/DRESSINGS) IMPLANT
SUT CHROMIC 0 CTX 36 (SUTURE) IMPLANT
SUT MON AB 4-0 PS1 27 (SUTURE) ×3 IMPLANT
SUT PLAIN 0 NONE (SUTURE) IMPLANT
SUT PLAIN 2 0 XLH (SUTURE) ×3 IMPLANT
SUT VIC AB 0 CTX 36 (SUTURE) ×10
SUT VIC AB 0 CTX36XBRD ANBCTRL (SUTURE) ×5 IMPLANT
SUT VIC AB 2-0 CT1 27 (SUTURE) ×2
SUT VIC AB 2-0 CT1 TAPERPNT 27 (SUTURE) ×1 IMPLANT
SYR CONTROL 10ML LL (SYRINGE) ×3 IMPLANT
TOWEL OR 17X24 6PK STRL BLUE (TOWEL DISPOSABLE) ×3 IMPLANT
TRAY FOLEY W/BAG SLVR 14FR LF (SET/KITS/TRAYS/PACK) IMPLANT

## 2017-11-30 NOTE — Anesthesia Preprocedure Evaluation (Addendum)
Anesthesia Evaluation  Patient identified by MRN, date of birth, ID band Patient awake    Reviewed: Allergy & Precautions, NPO status , Patient's Chart, lab work & pertinent test results  Airway Mallampati: I  TM Distance: >3 FB Neck ROM: Full    Dental no notable dental hx.    Pulmonary neg pulmonary ROS,    Pulmonary exam normal breath sounds clear to auscultation       Cardiovascular negative cardio ROS Normal cardiovascular exam Rhythm:Regular Rate:Normal     Neuro/Psych negative neurological ROS  negative psych ROS   GI/Hepatic negative GI ROS, Neg liver ROS,   Endo/Other  negative endocrine ROS  Renal/GU negative Renal ROS     Musculoskeletal negative musculoskeletal ROS (+)   Abdominal (+) + obese,   Peds  Hematology  (+) anemia , Thrombocytopenia    Anesthesia Other Findings history of myomectomy  Reproductive/Obstetrics (+) Pregnancy                            Anesthesia Physical Anesthesia Plan  ASA: III  Anesthesia Plan: Spinal   Post-op Pain Management:    Induction: Intravenous  PONV Risk Score and Plan: 2 and Ondansetron, Dexamethasone, Scopolamine patch - Pre-op and Treatment may vary due to age or medical condition  Airway Management Planned: Natural Airway  Additional Equipment:   Intra-op Plan:   Post-operative Plan:   Informed Consent: I have reviewed the patients History and Physical, chart, labs and discussed the procedure including the risks, benefits and alternatives for the proposed anesthesia with the patient or authorized representative who has indicated his/her understanding and acceptance.   Dental advisory given  Plan Discussed with: CRNA  Anesthesia Plan Comments:        Anesthesia Quick Evaluation

## 2017-11-30 NOTE — Transfer of Care (Signed)
Immediate Anesthesia Transfer of Care Note  Patient: Denise Castillo  Procedure(s) Performed: CESAREAN SECTION (N/A Abdomen)  Patient Location: PACU  Anesthesia Type:Spinal  Level of Consciousness: awake, alert  and oriented  Airway & Oxygen Therapy: Patient Spontanous Breathing  Post-op Assessment: Report given to RN and Post -op Vital signs reviewed and stable  Post vital signs: Reviewed and stable  Last Vitals:  Vitals Value Taken Time  BP 117/77 11/30/2017 11:16 AM  Temp    Pulse 60 11/30/2017 11:17 AM  Resp 14 11/30/2017 11:17 AM  SpO2 100 % 11/30/2017 11:17 AM  Vitals shown include unvalidated device data.  Last Pain:  Vitals:   11/30/17 0843  TempSrc:   PainSc: 0-No pain         Complications: No apparent anesthesia complications

## 2017-11-30 NOTE — Anesthesia Postprocedure Evaluation (Signed)
Anesthesia Post Note  Patient: Denise Castillo  Procedure(s) Performed: CESAREAN SECTION (N/A Abdomen)     Patient location during evaluation: Mother Baby Anesthesia Type: Spinal Level of consciousness: awake and alert Pain management: pain level controlled Vital Signs Assessment: post-procedure vital signs reviewed and stable Respiratory status: spontaneous breathing, nonlabored ventilation and respiratory function stable Cardiovascular status: stable Postop Assessment: no headache, no backache, spinal receding, able to ambulate, adequate PO intake, no apparent nausea or vomiting and patient able to bend at knees Anesthetic complications: no    Last Vitals:  Vitals:   11/30/17 1215 11/30/17 1309  BP:  119/74  Pulse:  (!) 57  Resp:  18  Temp: 36.7 C 36.7 C  SpO2:      Last Pain:  Vitals:   11/30/17 1315  TempSrc:   PainSc: 0-No pain   Pain Goal:                 [employer]

## 2017-11-30 NOTE — Brief Op Note (Addendum)
11/30/2017  11:08 AM  PATIENT:  Denise Castillo  32 y.o. female  PRE-OPERATIVE DIAGNOSIS:  IUP @ 37 1/7 weeks, history of myomectomy, Kenalog  POST-OPERATIVE DIAGNOSIS:  Same, focal accreta  PROCEDURE:  Procedure(s): CESAREAN SECTION (N/A), Primary LTCS  SURGEON:  Surgeon(s) and Role:    Thurnell Lose, MD - Primary    * Janyth Pupa, DO - Assisting  PHYSICIAN ASSISTANT: None  ASSISTANTS: Dr.Ozan   ANESTHESIA:   spinal  EBL:  1065 mL   BLOOD ADMINISTERED:none  DRAINS: Urinary Catheter (Foley)   LOCAL MEDICATIONS USED:  2 % LIDOCAINE , Amount: 15 ml and OTHER Kenalog 40 mg  SPECIMEN:  No Specimen  DISPOSITION OF SPECIMEN:  N/a  COUNTS:  YES  TOURNIQUET:  * No tourniquets in log *  DICTATION: .Other Dictation: Dictation Number [id number]  PLAN OF CARE: Admit to inpatient   PATIENT DISPOSITION:  PACU - hemodynamically stable.   Delay start of Pharmacological VTE agent (>24hrs) due to surgical blood loss or risk of bleeding: yes

## 2017-11-30 NOTE — Op Note (Signed)
NAMELEISHA, TRINKLE MEDICAL RECORD AT:[id number] ACCOUNT [id number] DATE OF BIRTH:25-May-1986 FACILITY: [facility] LOCATION: [facility] PHYSICIAN:Jamecia Lerman Al Decant, MD  OPERATIVE REPORT  DATE OF PROCEDURE:  11/30/2017  PREOPERATIVE DIAGNOSES: 1.  Intrauterine pregnancy at 37-1/7 weeks. 2.  History of myomectomy.  POSTOPERATIVE DIAGNOSES:   1.  Intrauterine pregnancy at 37-1/7 weeks. 2.  History of myomectomy. 3.  Focal accreta.  PROCEDURE:  Primary low-transverse cesarean section with 2-layer closure.  SURGEON:  Thurnell Lose, MD  ASSISTANT:  Janyth Pupa, DO  ESTIMATED BLOOD LOSS:  5427.  BLOOD ADMINISTERED:  None.  DRAINS:  Foley.  LOCAL:  2% lidocaine 15 mL mixed with Kenalog 40 mg.  SPECIMENS:  None.  PATIENT DISPOSITION:  To PACU hemodynamically stable.  FINDINGS:  Vertex female, small shoulder cord.  Thin meconium-stained fluid.  Normal uterus, fallopian tubes and ovaries bilaterally.  Small anterior fibroid about 2-3 cm.  DESCRIPTION OF PROCEDURE:  The patient was identified in the holding area.  She was then taken to the operating room with IV running.  She underwent spinal anesthesia without complication.  She was then prepped.  Timeout was performed.  SCDs were on her  legs and operating.  Ancef 2 g IV had been administered.  The patient was draped.  Adequate anesthesia was confirmed.  A Pfannenstiel skin incision was marked.  A scalpel was used to make a Pfannenstiel skin incision 2 cm above the symphysis pubis and carried down to the underlying layer of the fascia with the Bovie.  The incision was extended laterally.  Curved Mayo scissors were used to dissect the rectus  muscles off of the fascia.  The rectus muscles were adhesed cephalad, so Kochers were used to grasp at the midline, and a scalpel was used to separate the rectus muscles.  Peritoneum was then visualized.  A small window was developed, and that was  entered bluntly and stretched.  Alexis  retractor was placed after palpating the intraabdominal cavity.  Normal appendix noted.  Lower uterine segment was identified.  Bladder flap was developed with [place] and Metzenbaum scissors, and a transverse incision was made on the lower  uterine segment and extended laterally with the bandage scissors.  Meconium fluid noted.  Baby was direct OT, occiput transverse.  Due to my presentation, the occiput was brought to the incision and a vacuum was used to deliver the head atraumatically.   Nose and mouth were suctioned.  Delayed cord clamping was performed.  The baby had a lot of fluid, and we continued to suck that.  The cord was clamped x2 and cut after 1 minute.  While we were waiting, the hysterotomy incision was grasped with ring  forceps to minimize bleeding.  Cord blood was obtained, but the cord did not have large vessels.  Once we attempted to remove the placenta, it was somewhat adherent, but there was 1 small portion that was left behind.  There was some bleeding noted,  however.  With pressure, it improved.  The area of the placenta was on the posterior portion.  It was approximately 3 cm in size.  In lieu of removing the portion of placenta, we opted to leave that in place and follow up with methotrexate postpartum.  The uterus was closed with 0 Vicryl in a continuous locked fashion.  A second layer of the same suture was used for imbrication.  Another suture was used for hemostasis.  Copious irrigation of the abdomen was performed.  Interceed was then applied.  The  gutters  of the abdomen were cleared of all clots and debris with copious irrigation.  The peritoneum was then reapproximated with 2-0 Vicryl in continuous running fashion.  The fascia was then reapproximated with 0 Vicryl x2.  Subcutaneous space was  reapproximated with 2-0 plain gut, and the skin was reapproximated with 4-0 Monocryl in a subcuticular fashion due to a history of keloid.  Kenalog and lidocaine were  injected.  LN/NUANCE  D:11/30/2017 T:11/30/2017 JOB:[id number]

## 2017-11-30 NOTE — Anesthesia Procedure Notes (Signed)
Spinal  Patient location during procedure: OR Start time: 11/30/2017 9:40 AM End time: 11/30/2017 9:50 AM Staffing Anesthesiologist: Murvin Natal, MD Performed: anesthesiologist  Preanesthetic Checklist Completed: patient identified, surgical consent, pre-op evaluation, timeout performed, IV checked, risks and benefits discussed and monitors and equipment checked Spinal Block Patient position: sitting Prep: DuraPrep Patient monitoring: cardiac monitor, continuous pulse ox and blood pressure Approach: midline Location: L4-5 Injection technique: single-shot Needle Needle type: Pencan  Needle gauge: 24 G Needle length: 9 cm Assessment Sensory level: T10 Additional Notes Functioning IV was confirmed and monitors were applied. Sterile prep and drape, including hand hygiene and sterile gloves were used. The patient was positioned and the spine was prepped. The skin was anesthetized with lidocaine.  Free flow of clear CSF was obtained prior to injecting local anesthetic into the CSF.  The spinal needle aspirated freely following injection.  The needle was carefully withdrawn.  The patient tolerated the procedure well.

## 2017-11-30 NOTE — Lactation Note (Signed)
This note was copied from a baby's chart. Lactation Consultation Note  Patient Name: Denise Castillo [id number] Date: 11/30/2017 Reason for consult: Initial assessment;NICU baby   Had request to go to room because mother had pumping question. P1, Baby 7 hours old [redacted]w[redacted]d and in transitional care. Reviewed hand expression.  Nipples evert and compressible. Drops expressed. Provided mother w/ NICU booklet and labels.  Helped mother w/ pumping using hands on pumping.  Recommend she hand express before and after pumping. Encouraged her to send colostrum to NICU within 4 hours. Suggest mother pump q 2-2.5 hours.   Mother had attempted to breastfeed prior to move to transitional care.   Maternal Data Has patient been taught Hand Expression?: Yes Does the patient have breastfeeding experience prior to this delivery?: No  Feeding Feeding Type: Donor Breast Milk Nipple Type: Slow - flow Length of feed: 10 min  LATCH Score                   Interventions Interventions: DEBP  Lactation Tools Discussed/Used Initiated by:: RN   Consult Status Consult Status: Follow-up Date: 12/01/17 Follow-up type: In-patient    Vivianne Master [facility] 11/30/2017, 6:33 PM

## 2017-11-30 NOTE — Interval H&P Note (Signed)
History and Physical Interval Note:  11/30/2017 9:27 AM  Denise Castillo  has presented today for surgery, with the diagnosis of history of myomectomy  The various methods of treatment have been discussed with the patient and family. After consideration of risks, benefits and other options for treatment, the patient has consented to  Procedure(s): CESAREAN SECTION (N/A) as a surgical intervention .  The patient's history has been reviewed, patient examined, no change in status, stable for surgery.  I have reviewed the patient's chart and labs.  Questions were answered to the patient's satisfaction.     Thurnell Lose

## 2017-11-30 NOTE — H&P (Signed)
Denise Castillo is a 32 y.o. female G1 @ 37 1/7 weeks presenting for primary cesarean section due to h/o myomectomy.   Pregnancy complicated by Gestational Thrombocytopenia for which she has been taking Dexamethasone x 6 days.  Pt is also s/p BMZ for previously planned preterm delivery that was postponed.  Pt admitted 4 days ago due to decreased fetal movemen twith  nonreactive tracing but reassuring BPP.  Pt was observed iand discharged witihin 24 hour when movement improved and fetal tracing became reactive.  Pt has a h/o myomectomy  With removal of large 10 cm fibroid.  Endometrial cavity "encountered but not entered" and there fore c-section was recommended by Dr. Karen Kitchens.  Pt had placental lakes noted on ultrasound.  Pt seen by MFM twice and no evidence of accreta was suspected.  Prenatal care with [facility] Simona Huh).   OB History    Gravida  1   Para  0   Term  0   Preterm  0   AB  0   Living  0     SAB  0   TAB  0   Ectopic  0   Multiple  0   Live Births             Past Medical History:  Diagnosis Date  . Iron deficiency anemia   . Seasonal allergies   . Uterine fibroid   . Wears contact lenses    Past Surgical History:  Procedure Laterality Date  . LAPAROSCOPIC GELPORT ASSISTED MYOMECTOMY N/A 12/14/2015   Procedure: LAPAROSCOPIC GELPORT ASSISTED MYOMECTOMY;  Surgeon: Governor Specking, MD;  Location: [facility];  Service: Gynecology;  Laterality: N/A;  . WRIST SURGERY     Family History: family history includes Hypertension in her maternal aunt, maternal grandfather, maternal grandmother, maternal uncle, and mother. Social History:  reports that she has never smoked. She has never used smokeless tobacco. She reports that she does not drink alcohol or use drugs.     Maternal Diabetes: No Genetic Screening: Normal Maternal Ultrasounds/Referrals: Abnormal:  Findings:   Other:Placental lakes. Fetal Ultrasounds or other Referrals:   Referred to Materal Fetal Medicine  Maternal Substance Abuse:  No Significant Maternal Medications:  Meds include: Other: Betamethasone, Dexamethasone. Significant Maternal Lab Results:  None Other Comments:  Gestational Thrombocytopenia.  ROS Maternal Medical History:  Prenatal complications: Thrombocytopenia.   Prenatal Complications - Diabetes: none.      Last menstrual period 03/15/2017. Maternal Exam:  Abdomen: Fetal presentation: vertex  Cervix: not evaluated.   Physical Exam  Constitutional: She is oriented to person, place, and time. She appears well-developed and well-nourished.  HENT:  Head: Atraumatic.  Neck: Normal range of motion.  Respiratory: Effort normal.  GI: There is no tenderness.  Musculoskeletal: She exhibits edema. She exhibits no tenderness.  Neurological: She is alert and oriented to person, place, and time.  Skin: Skin is warm and dry.  Psychiatric: She has a normal mood and affect.    Prenatal labs: ABO, Rh: --/--/O POS, O POS Performed at [facility], [address], [place], [place] [address]  [phone]/24 1422) Antibody: NEG (06/24 1422) Rubella: Immune (11/30 0000) RPR: Nonreactive (11/30 0000)  HBsAg: Negative (11/30 0000)  HIV: Non-reactive (11/30 0000)  GBS:     Assessment/Plan: IUP @ 14 1/7 weeks H/o Myomectomy for primary c-section. Gestational Thrombocytopenia. Recent Decreased fetal movement with reassuring BPP.  Improved fetal movement with observation.  Primary c-section. Platelets to be drawn on day of surgery.  Pt aware that general anesthesia will be required if platelets are below threshold of anesthesiologist. Pt also aware of risk of abnormal placentation requiring hysterectomy and/or blood transfusion.   Thurnell Lose 11/30/2017, 6:16 AM

## 2017-12-01 ENCOUNTER — Other Ambulatory Visit: Payer: Self-pay

## 2017-12-01 LAB — CBC
HEMATOCRIT: 35.4 % — AB (ref 36.0–46.0)
Hemoglobin: 12.1 g/dL (ref 12.0–15.0)
MCH: 30.4 pg (ref 26.0–34.0)
MCHC: 34.2 g/dL (ref 30.0–36.0)
MCV: 88.9 fL (ref 78.0–100.0)
Platelets: 92 10*3/uL — ABNORMAL LOW (ref 150–400)
RBC: 3.98 MIL/uL (ref 3.87–5.11)
RDW: 15.1 % (ref 11.5–15.5)
WBC: 14.8 10*3/uL — AB (ref 4.0–10.5)

## 2017-12-01 LAB — RPR: RPR Ser Ql: NONREACTIVE

## 2017-12-01 MED ORDER — IBUPROFEN 600 MG PO TABS
600.0000 mg | ORAL_TABLET | Freq: Four times a day (QID) | ORAL | Status: DC
  Administered 2017-12-02 – 2017-12-03 (×4): 600 mg via ORAL
  Filled 2017-12-01 (×4): qty 1

## 2017-12-01 MED ORDER — KETOROLAC TROMETHAMINE 30 MG/ML IJ SOLN
30.0000 mg | Freq: Four times a day (QID) | INTRAMUSCULAR | Status: AC
  Administered 2017-12-01 – 2017-12-02 (×4): 30 mg via INTRAVENOUS
  Filled 2017-12-01 (×4): qty 1

## 2017-12-01 NOTE — Progress Notes (Signed)
Subjective: Postpartum Day 1: Cesarean Delivery Patient reports incisional pain and tolerating PO.  Patient has not ambulated yet. Foley was discontinued this am. She denies CP, SOB, no nausea or vomiting.  Reports bleeding is minimal.   Objective: Vital signs in last 24 hours: Temp:  [97.9 F (36.6 C)-98.3 F (36.8 C)] 98.1 F (36.7 C) (06/29 0540) Pulse Rate:  [56-69] 65 (06/29 0612) Resp:  [16-18] 16 (06/29 0540) BP: (106-126)/(59-89) 110/74 (06/29 0615) SpO2:  [96 %-100 %] 97 % (06/29 0540)  Physical Exam:  General: alert, cooperative and no distress Lochia: appropriate Uterine Fundus: firm Incision: healing well, no significant drainage, pressure bandage is on DVT Evaluation: No evidence of DVT seen on physical exam. Negative Homan's sign.  Recent Labs    11/30/17 0825 12/01/17 0628  HGB 12.5 12.1  HCT 36.5 35.4*    Assessment/Plan: Status post Cesarean section. Doing well postoperatively.  Continue current care. Saline lock IV Platelets have improved to 92, so anti-inflammatories are okay now,  Will give IV toradol to aid with pain management.  Encouraged patient to ambulate.  She will remove pressure bandage in shower.   Kewanna Kasprzak, [place] 12/01/2017, 9:21 AM

## 2017-12-01 NOTE — Lactation Note (Signed)
This note was copied from a baby's chart. Lactation Consultation Note Baby 85 hrs old. Transferred to mom from NICU.  Mom states baby is BF well after latched to breast.  Mom has cone shaped breast w/short shaft nipples. Compressible for latching at this time. Shells given d/t concern after milk comes in breast tissue may not be as compressible and have difficulty latching.  Encouraged mom to use DEBP d/t baby under 6 lbs and need for supplementation. Encouraged hand expression after pumping, giving any colostrum collected then supplement w/formula according to hrs old age w/LPI information sheet that was reviewed. Demonstrated breast massage while feeding and pumping.  Taught FOB how to finger feed baby w/gloved finger and curve tip syring. Similac 22 cal given. FOB fed baby well. Encouraged to wake baby if hasn't cued in 3 hrs.  Call for assistance or concerns.  Patient Name: Denise Castillo [id number] Date: 12/01/2017 Reason for consult: Follow-up assessment;Early term 37-38.6wks;Infant < 6lbs   Maternal Data    Feeding Feeding Type: Formula Length of feed: 25 min  LATCH Score Latch: Grasps breast easily, tongue down, lips flanged, rhythmical sucking.  Audible Swallowing: A few with stimulation  Type of Nipple: Everted at rest and after stimulation  Comfort (Breast/Nipple): Soft / non-tender  Hold (Positioning): Assistance needed to correctly position infant at breast and maintain latch.  LATCH Score: 8  Interventions Interventions: Assisted with latch;Skin to skin;Breast feeding basics reviewed;Hand express;Adjust position;Support pillows  Lactation Tools Discussed/Used Tools: Shells;Pump Shell Type: Inverted Breast pump type: Double-Electric Breast Pump Pump Review: Setup, frequency, and cleaning;Milk Storage Date initiated:: 11/30/17   Consult Status Consult Status: Follow-up Date: 12/02/17 Follow-up type: In-patient    Theodoro Kalata 12/01/2017, 12:35  AM

## 2017-12-02 NOTE — Lactation Note (Addendum)
This note was copied from a baby's chart. Lactation Consultation Note  Patient Name: Denise Castillo [id number] Date: 12/02/2017 Reason for consult: Follow-up assessment;Early term 37-38.6wks;Infant weight loss  Visited with P1 Mom of [redacted]w[redacted]d baby at 17 hrs old.  Baby <6 lbs and at 9% weight loss.   Mom breastfeeding baby, and then supplementing with formula.  Increasing volume to 30-40 ml as tolerated 22 cal formula. Reviewed paced bottle feeding. Mom has a "wireless" hands free double pump which she brought from home.  Encouraged mom to use Symphony DEBP due to maximum stimulation to breasts best.  Talked about rental pumps available in gift shop.  Recommended she rent a Symphony at discharge.  Encouraged STS as much as possible.  Room full of visitors currently.   Mom knows to awaken baby for feedings at 3 hrs. To ask for help prn.  Consult Status Follow-up Outpatient appt 7/3 - 7/4   Broadus John 12/02/2017, 2:56 PM

## 2017-12-02 NOTE — Progress Notes (Signed)
Denise Castillo [id number] Postpartum Postoperative Day # 2 [facility] pt of Dr Ewell Poe, G1P1001, [redacted]w[redacted]d, S/P primary LT Cesarean Section due to h/o myomectomy and gestational thrombocytopenia. Post surgery pt was left with a small cut down foccal accreta, stable and no increased bleeding over postpartum period. EBL 1000 during surgery was and platlets yesterday were 92. MD Pinn gave the ok for pt to receive tordal for pain control.   Subjective: Pt found resting in bed with infant in arms bonding well.  Pt denies getting much sleep and would like th einfant to go to the [facility] for her to get a quick nap. Patient up ad lib, denies syncope or dizziness. Reports consuming regular diet without issues and denies N/V. Patient reports 0bowel movement + passing flatus.  Denies issues with urination and reports bleeding is "light."  Patient is breast and bottle feeding and reports going well.  Desires mini pill for postpartum contraception.  Pain is being appropriately managed with use of motrin/tylenol/narcotics.    Objective: Patient Vitals for the past 24 hrs:  BP Temp Temp src Pulse Resp SpO2  12/02/17 0615 114/77 98.2 F (36.8 C) Oral 66 18 -  12/01/17 2117 106/63 98.5 F (36.9 C) Oral 72 18 97 %  12/01/17 1552 112/72 98.7 F (37.1 C) Oral 65 16 98 %     Physical Exam:  General: alert, cooperative, appears stated age and no distress Mood/Affect: Happy Lungs: clear to auscultation, no wheezes, rales or rhonchi, symmetric air entry.  Heart: normal rate, regular rhythm, normal S1, S2, no murmurs, rubs, clicks or gallops. Breast: breasts appear normal, no suspicious masses, no skin or nipple changes or axillary nodes. Abdomen:  + bowel sounds, soft, non-tender Incision: healing well, no significant drainage, Honeycomb dressing CDI Uterine Fundus: firm, involution -2 Lochia: appropriate Skin: Warm, Dry. DVT Evaluation: No evidence of DVT seen on physical exam. Negative Homan's sign. No  cords or calf tenderness. Generalized lower extremity non-pitting edema.   Labs: Recent Labs    11/30/17 0825 12/01/17 0628  HGB 12.5 12.1  HCT 36.5 35.4*  WBC 7.4 14.8*    CBG (last 3)  No results for input(s): GLUCAP in the last 72 hours.   I/O: I/O last 3 completed shifts: In: 866.3 [P.O.:360; I.V.:506.3] Out: 1550 [Urine:1550]   Assessment Postpartum Postoperative Day # 2. [facility], Dr Mearl Latin pt  Denise Castillo, G1P1001, [redacted]w[redacted]d, S/P  primary LT Cesarean Section due to h/o myomectomy and gestational thrombocytopenia. Post surgery pt was left with a small cut down foccal accreta, stable and no increased bleeding over postpartum period. EBL 1000 during surgery was and platlets yesterday were 92. MD Pinn gave the ok for pt to receive tordal for pain control.  Pt stable. -2 Involution. Bottle and BreastFeeding. Hemodynamically Stable.  Plan: Continue other mgmt as ordered VTE Prophylactics: SCD, ambulated as tolerates.  Pain control: Motrin/Tylenol/Narcotics PRN Education given regarding options for contraception, including oral contraceptives. Mini Pill.   Plan for discharge tomorrow, Breastfeeding and Lactation consult  Dr. Mancel Bale to be updated on patient status  Denise Heidemann NP-C, CNM 12/02/2017, 11:30 AM

## 2017-12-03 LAB — BPAM RBC
BLOOD PRODUCT EXPIRATION DATE: 201907262359
Blood Product Expiration Date: 201907262359
ISSUE DATE / TIME: 201906270529
UNIT TYPE AND RH: 5100
Unit Type and Rh: 5100

## 2017-12-03 LAB — TYPE AND SCREEN
ABO/RH(D): O POS
Antibody Screen: NEGATIVE
Unit division: 0
Unit division: 0

## 2017-12-03 MED ORDER — OXYCODONE HCL 5 MG PO TABS: 5.0000 mg | ORAL_TABLET | ORAL | 0 refills | Status: DC | PRN

## 2017-12-03 MED ORDER — IBUPROFEN 600 MG PO TABS: 600.0000 mg | ORAL_TABLET | Freq: Four times a day (QID) | ORAL | 1 refills | Status: DC

## 2017-12-03 NOTE — Lactation Note (Addendum)
This note was copied from a baby's chart. Lactation Consultation Note  Patient Name: Denise Castillo [id number]'[id number] Date: 12/03/2017 Reason for consult: Follow-up assessment;Early term 37-38.6wks;Infant < 6lbs  Visited with P31 Mom of ET infant, <6 lbs, baby 83 hrs old.  Baby at 9% weight loss, but did gain 1/2 oz from yesterday.  Baby breastfeeding at least every 3 hrs, and supplementing with 22 cal formula+/EBM by paced bottle feeding.  Volume increasing per guidelines, up to 25 ml today.  Output good. Mom denies any difficulty with latching baby.    Encouraged STS, and feeding often when she cues or at 3 hrs.    Engorgement prevention and treatment discussed.   Both breasts filling, but soft to palpation.  Mom aware of OP lactation services, and encouraged to call prn.  Mom to rent a Symphony, as pump from insurance a "wireless" hands free.  Mom wanting to follow-up with OP lactation.  Appointment requested for this week.  Interventions Interventions: Breast feeding basics reviewed;Skin to skin;Shells;DEBP  Lactation Tools Discussed/Used Tools: Shells;Pump;Bottle Shell Type: Inverted Breast pump type: Double-Electric Breast Pump WIC Program: No   Consult Status Consult Status: Complete Date: 12/06/17 Follow-up type: [place], Stehanie Ekstrom E 12/03/2017, 9:55 AM

## 2017-12-03 NOTE — Discharge Instructions (Signed)

## 2017-12-03 NOTE — Discharge Summary (Signed)
OB Discharge Summary     Patient Name: Denise Castillo DOB: Jan 06, 1986 MRN: [id number]  Date of admission: 11/30/2017 Delivering MD: Thurnell Lose   Date of discharge: 12/03/2017  Admitting diagnosis: history of myomectomy, gestational thrombocytopenia Intrauterine pregnancy: [redacted]w[redacted]d     Secondary diagnosis:  Active Problems:   S/P cesarean section  Additional problems: Focal accreta     Discharge diagnosis: Preterm Pregnancy Delivered                                                                                                Post partum procedures:None  Augmentation: N/a  Complications: None  Hospital course:  Sceduled C/S   32 y.o. yo G1P1001 at [redacted]w[redacted]d was admitted to the hospital 11/30/2017 for scheduled cesarean section with the following indication:H/o Myomectomy.  Membrane Rupture Time/Date: 10:12 AM ,11/30/2017   Patient delivered a Viable infant.11/30/2017  Details of operation can be found in separate operative note.  Pateint had an uncomplicated postpartum course.  She is ambulating, tolerating a regular diet, passing flatus, and urinating well. Patient is discharged home in stable condition on  12/03/17         Physical exam  Vitals:   12/02/17 0615 12/02/17 1450 12/02/17 2152 12/03/17 0536  BP: 114/77 115/72 115/66 119/63  Pulse: 66 68 72 70  Resp: 18 16 18 18   Temp: 98.2 F (36.8 C) 97.8 F (36.6 C) 98.5 F (36.9 C) 98.1 F (36.7 C)  TempSrc: Oral Oral Oral Oral  SpO2:      Weight:      Height:       General: alert, cooperative and no distress Lochia: appropriate Uterine Fundus: firm Incision: Dressing is clean, dry, and intact DVT Evaluation: No evidence of DVT seen on physical exam. Labs: Lab Results  Component Value Date   WBC 14.8 (H) 12/01/2017   HGB 12.1 12/01/2017   HCT 35.4 (L) 12/01/2017   MCV 88.9 12/01/2017   PLT 92 (L) 12/01/2017   No flowsheet data found.  Discharge instruction: per After Visit Summary and "Baby and Me  Booklet".  After visit meds:  Allergies as of 12/03/2017   No Known Allergies     Medication List    TAKE these medications   CITRANATAL HARMONY 27-1-260 MG Caps Take 1 tablet by mouth daily after lunch.   docusate sodium 50 MG capsule Commonly known as:  COLACE Take 50 mg by mouth daily as needed for mild constipation.   ibuprofen 600 MG tablet Commonly known as:  ADVIL,MOTRIN Take 1 tablet (600 mg total) by mouth every 6 (six) hours.   INTEGRA PO Take 1 tablet by mouth daily.   oxyCODONE 5 MG immediate release tablet Commonly known as:  Oxy IR/ROXICODONE Take 1 tablet (5 mg total) by mouth every 4 (four) hours as needed (pain scale 4-7).       Diet: routine diet  Activity: Advance as tolerated. Pelvic rest for 6 weeks.   Outpatient follow up:2 weeks Follow up Appt:No future appointments. Follow up Visit:No follow-ups on file.  Postpartum contraception: Not Discussed  Newborn Data: Live born female  Birth Weight: 5 lb 14.9 oz (2690 g) APGAR: 8, 8  Newborn Delivery   Birth date/time:  11/30/2017 10:14:00 Delivery type:  C-Section, Vacuum Assisted Trial of labor:  No C-section categorization:  Primary     Baby Feeding: Breast Disposition:home with mother   12/03/2017 Thurnell Lose, MD

## 2017-12-04 ENCOUNTER — Telehealth: Payer: Self-pay

## 2017-12-04 LAB — BIRTH TISSUE RECOVERY COLLECTION (PLACENTA DONATION)

## 2017-12-04 NOTE — Telephone Encounter (Signed)
Patient called concerned that the baby is falling asleep while breastfeeding.  Left message giving patient some advise and gave her lactation number for further questions.

## 2018-02-11 ENCOUNTER — Telehealth: Payer: Self-pay | Admitting: Oncology

## 2018-02-11 NOTE — Telephone Encounter (Signed)
New hematology referral received from Dr. Simona Huh for h/o gestational thrombocytopenia. Pt has been scheduled to see Mikey Bussing on 9/30 at 1pm. Pt aware to arrive 30 minutes early. Letter mailed.

## 2018-03-01 NOTE — Progress Notes (Signed)
[place] [phone]   Fax:(336) [phone]            CONSULT NOTE  REFERRING PHYSICIAN:  Dr. Thurnell Lose  REASON FOR CONSULTATION: Thrombocytopenia  HPI: Regnia Mathwig is a 32 y.o. female with a past medical history including fibroids.  The patient is seen at the request of Dr. Felicita Gage for thrombocytopenia.  The patient states that she had no history of thrombocytopenia up until her recent pregnancy.  Labs reviewed in epic and from her obstetrician show that her platelet count dropped to as low as 72,000 and improved to 92,000 one day after delivery of her daughter.  The most recent CBC available to me is from 01/07/2018 when her platelet count was 107,000.  She was referred to [name] because her platelet count had not completely recovered.  The patient states that she received steroids near the time of her C-section.  She reports that she did not have any significant bleeding with the C-section. Today the patient feels well.  She does report fatigue but attributes this to having an infant.  She has not had any epistaxis, bleeding gums, hemoptysis, hematuria, or melena. Denies petechiae and bruising.  She denies fevers and chills.  Denies chest pain, shortness of breath, cough.  Denies nausea, vomiting, constipation, diarrhea.  She denies recent weight loss or night sweats. The patient's family history is significant for a sister with anemia, and a mother and maternal grandparents with hypertension, a grandfather with prostate cancer, and she reports that her daughter has sickle cell trait which was inherited from the paternal side of the family. The patient is married.  She has 1 infant child who is 51 months old.  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  Denies alcohol, tobacco, and illicit drug use.   Past Medical History:  Diagnosis Date  . Iron deficiency anemia   . Seasonal allergies   . Uterine fibroid   . Wears contact  lenses   :   Past Surgical History:  Procedure Laterality Date  . CESAREAN SECTION N/A 11/30/2017   Procedure: CESAREAN SECTION;  Surgeon: Thurnell Lose, MD;  Location: [place];  Service: Obstetrics;  Laterality: N/A;  . LAPAROSCOPIC GELPORT ASSISTED MYOMECTOMY N/A 12/14/2015   Procedure: LAPAROSCOPIC GELPORT ASSISTED MYOMECTOMY;  Surgeon: Governor Specking, MD;  Location: [place];  Service: Gynecology;  Laterality: N/A;  . WRIST SURGERY    :   CURRENT MEDS: Current Outpatient Medications  Medication Sig Dispense Refill  . LO LOESTRIN FE 1 MG-10 MCG / 10 MCG tablet Take 1 tablet by mouth daily.  4   No current facility-administered medications for this visit.     No Known Allergies:  Family History  Problem Relation Age of Onset  . Hypertension Mother   . Hypertension Maternal Aunt   . Hypertension Maternal Uncle   . Hypertension Maternal Grandmother   . Hypertension Maternal Grandfather   :  Social History   Socioeconomic History  . Marital status: Married    Spouse name: Not on file  . Number of children: Not on file  . Years of education: Not on file  . Highest education level: Not on file  Occupational History  . Not on file  Social Needs  . Financial resource strain: Not on file  . Food insecurity:    Worry: Not on file    Inability: Not on file  . Transportation needs:    Medical:  Not on file    Non-medical: Not on file  Tobacco Use  . Smoking status: Never Smoker  . Smokeless tobacco: Never Used  Substance and Sexual Activity  . Alcohol use: No  . Drug use: No  . Sexual activity: Not Currently    Partners: Male    Birth control/protection: None  Lifestyle  . Physical activity:    Days per week: Not on file    Minutes per session: Not on file  . Stress: Not on file  Relationships  . Social connections:    Talks on phone: Not on file    Gets together: Not on file    Attends religious service: Not on file    Active  member of club or organization: Not on file    Attends meetings of clubs or organizations: Not on file    Relationship status: Not on file  . Intimate partner violence:    Fear of current or ex partner: Not on file    Emotionally abused: Not on file    Physically abused: Not on file    Forced sexual activity: Not on file  Other Topics Concern  . Not on file  Social History Narrative  . Not on file  :  REVIEW OF SYSTEMS:   Constitutional: Negative for appetite change, chills, fever and unexpected weight change.  Positive for fatigue. HENT:   Negative for mouth sores, nosebleeds, sore throat and trouble swallowing.   Eyes: Negative for eye problems and icterus.  Respiratory: Negative for cough, hemoptysis, shortness of breath and wheezing.   Cardiovascular: Negative for chest pain and leg swelling.  Gastrointestinal: Negative for abdominal pain, constipation, diarrhea, nausea and vomiting.  Genitourinary: Negative for bladder incontinence, difficulty urinating, dysuria, frequency and hematuria.   Musculoskeletal: Negative for back pain, gait problem, neck pain and neck stiffness.  Skin: Negative for itching and rash.  Neurological: Negative for dizziness, extremity weakness, gait problem, headaches, light-headedness and seizures.  Hematological: Negative for adenopathy. Does not bruise/bleed easily.  Psychiatric/Behavioral: Negative for confusion, depression and sleep disturbance. The patient is not nervous/anxious.     PHYSICAL EXAMINATION: Blood pressure 106/67, pulse 68, temperature (!) 97.5 F (36.4 C), temperature source Oral, resp. rate 16, height 5\' 4"  (1.626 m), weight 165 lb 3.2 oz (74.9 kg), SpO2 99 %, unknown if currently breastfeeding.  ECOG PERFORMANCE STATUS: 0 - Asymptomatic  Physical Exam  Constitutional: Oriented to person, place, and time and well-developed, well-nourished, and in no distress. No distress.  HENT:  Head: Normocephalic and atraumatic.    Mouth/Throat: Oropharynx is clear and moist. No oropharyngeal exudate.  Eyes: Conjunctivae are normal. Right eye exhibits no discharge. Left eye exhibits no discharge. No scleral icterus.  Neck: Normal range of motion. Neck supple.  Cardiovascular: Normal rate, regular rhythm, normal heart sounds and intact distal pulses.   Pulmonary/Chest: Effort normal and breath sounds normal. No respiratory distress. No wheezes. No rales.  Abdominal: Soft. Bowel sounds are normal. Exhibits no distension and no mass. There is no tenderness.  Musculoskeletal: Normal range of motion. Exhibits no edema.  Lymphadenopathy:    No cervical adenopathy.  Neurological: Alert and oriented to person, place, and time. Exhibits normal muscle tone. Gait normal. Coordination normal.  Skin: Skin is warm and dry. No rash noted. Not diaphoretic. No erythema. No pallor.  Psychiatric: Mood, memory and judgment normal.  Vitals reviewed.    LABS:  Lab Results  Component Value Date   WBC 7.0 03/04/2018   HGB 13.1 03/04/2018  HCT 40.1 03/04/2018   PLT 126 (L) 03/04/2018    No results found.  ASSESSMENT: This is a pleasant 32 year old African-American female with thrombocytopenia diagnosed during her recent pregnancy.  Platelet count today is up to 126,000.  PLAN: The patient was seen by Dr. Julien Nordmann.  Results of today's CBC were discussed with the patient.  We discussed that thrombocytopenia is expected during pregnancy.  The patient's platelet count is slowly recovering.  We discussed that her platelet count remains above 100,000, no treatment is indicated.  We also discussed that she may have a component of ITP and treatment would only be indicated if she has bleeding or bruising. We will see the patient back in 2 months with a repeat CBC.  She was advised to call us if she develops any bleeding or bruising prior to her visit.  She was advised to call immediately if she has any concerning symptoms in the interval. The  patient voices understanding of current disease status and treatment options and is in agreement with the current care plan.   All questions were answered. The patient knows to call the clinic with any problems, questions or concerns. We can certainly see the patient much sooner if necessary.   Thank you so much for allowing me to participate in the care of Laityn Bensen. I will continue to follow up the patient with you and assist in her care.  Orders Placed This Encounter  Procedures  . CBC with Differential (Cancer Center Only)    Standing Status:   Future    Number of Occurrences:   1    Standing Expiration Date:   03/05/2019  . CBC with Differential (Cancer Center Only)    Standing Status:   Future    Standing Expiration Date:   03/05/2019   Mikey Bussing, DNP, AGPCNP-BC, AOCNP  ADDENDUM: Hematology/Oncology Attending: I had a face-to-face encounter with the patient.  I recommended her care plan.  This is a very pleasant 32 years old African-American female who presented for evaluation of persistent thrombocytopenia.  The patient has thrombocytopenia of pregnancy and her platelets count were down to 70,000.  She was treated with prednisone before her labor and repeat CBC after delivery showed improvement of her platelets count to 107,000. The patient is feeling fine and she had no bleeding issues.  She denied having any bruises or petechiae.  She had repeat CBC performed earlier today and her platelets count had further improved to 126,000. The patient is currently asymptomatic. I had a lengthy discussion with her about her condition.  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 I recommended for her to continue on observation for now. We will see her back for follow-up visit in 2 months for evaluation and repeat CBC.  If the patient has persistent thrombocytopenia in 2 months, this would be consistent with ITP and she will be monitored closely and treatment only if her  platelets count is less than 50,000 or if she has any bleeding issues. She was advised to call immediately if she has any concerning symptoms in the interval.  Disclaimer: This note was dictated with voice recognition software. Similar sounding words can inadvertently be transcribed and may be missed upon review. Eilleen Kempf, MD 03/04/18

## 2018-03-04 ENCOUNTER — Inpatient Hospital Stay: Payer: BLUE CROSS/BLUE SHIELD

## 2018-03-04 ENCOUNTER — Encounter: Payer: Self-pay | Admitting: Oncology

## 2018-03-04 ENCOUNTER — Inpatient Hospital Stay: Payer: BLUE CROSS/BLUE SHIELD | Attending: Oncology | Admitting: Oncology

## 2018-03-04 ENCOUNTER — Telehealth: Payer: Self-pay | Admitting: Oncology

## 2018-03-04 VITALS — BP 106/67 | HR 68 | Temp 97.5°F | Resp 16 | Ht 64.0 in | Wt 165.2 lb

## 2018-03-04 DIAGNOSIS — D696 Thrombocytopenia, unspecified: Secondary | ICD-10-CM

## 2018-03-04 LAB — CBC WITH DIFFERENTIAL (CANCER CENTER ONLY)
Basophils Absolute: 0.1 10*3/uL (ref 0.0–0.1)
Basophils Relative: 1 %
Eosinophils Absolute: 0.2 10*3/uL (ref 0.0–0.5)
Eosinophils Relative: 3 %
HEMATOCRIT: 40.1 % (ref 34.8–46.6)
HEMOGLOBIN: 13.1 g/dL (ref 11.6–15.9)
Lymphocytes Relative: 23 %
Lymphs Abs: 1.6 10*3/uL (ref 0.9–3.3)
MCH: 29.2 pg (ref 25.1–34.0)
MCHC: 32.7 g/dL (ref 31.5–36.0)
MCV: 89.3 fL (ref 79.5–101.0)
MONOS PCT: 9 %
Monocytes Absolute: 0.6 10*3/uL (ref 0.1–0.9)
NEUTROS PCT: 64 %
Neutro Abs: 4.6 10*3/uL (ref 1.5–6.5)
Platelet Count: 126 10*3/uL — ABNORMAL LOW (ref 145–400)
RBC: 4.5 MIL/uL (ref 3.70–5.45)
RDW: 14 % (ref 11.2–14.5)
WBC: 7 10*3/uL (ref 3.9–10.3)

## 2018-03-04 NOTE — Telephone Encounter (Signed)
Lab appt added per [facility]

## 2018-03-04 NOTE — Telephone Encounter (Signed)
Gave pt avs and calendar  °

## 2018-04-29 ENCOUNTER — Inpatient Hospital Stay: Payer: BLUE CROSS/BLUE SHIELD | Attending: Oncology

## 2018-04-29 ENCOUNTER — Inpatient Hospital Stay (HOSPITAL_BASED_OUTPATIENT_CLINIC_OR_DEPARTMENT_OTHER): Payer: BLUE CROSS/BLUE SHIELD | Admitting: Internal Medicine

## 2018-04-29 ENCOUNTER — Encounter: Payer: Self-pay | Admitting: Internal Medicine

## 2018-04-29 DIAGNOSIS — D6959 Other secondary thrombocytopenia: Secondary | ICD-10-CM | POA: Diagnosis not present

## 2018-04-29 DIAGNOSIS — D696 Thrombocytopenia, unspecified: Secondary | ICD-10-CM | POA: Insufficient documentation

## 2018-04-29 DIAGNOSIS — O99119 Other diseases of the blood and blood-forming organs and certain disorders involving the immune mechanism complicating pregnancy, unspecified trimester: Secondary | ICD-10-CM

## 2018-04-29 DIAGNOSIS — O99113 Other diseases of the blood and blood-forming organs and certain disorders involving the immune mechanism complicating pregnancy, third trimester: Principal | ICD-10-CM

## 2018-04-29 LAB — CBC WITH DIFFERENTIAL (CANCER CENTER ONLY)
ABS IMMATURE GRANULOCYTES: 0.01 10*3/uL (ref 0.00–0.07)
BASOS ABS: 0 10*3/uL (ref 0.0–0.1)
Basophils Relative: 1 %
Eosinophils Absolute: 0.1 10*3/uL (ref 0.0–0.5)
Eosinophils Relative: 3 %
HEMATOCRIT: 43.6 % (ref 36.0–46.0)
HEMOGLOBIN: 14.2 g/dL (ref 12.0–15.0)
IMMATURE GRANULOCYTES: 0 %
LYMPHS ABS: 1.4 10*3/uL (ref 0.7–4.0)
LYMPHS PCT: 36 %
MCH: 29.2 pg (ref 26.0–34.0)
MCHC: 32.6 g/dL (ref 30.0–36.0)
MCV: 89.7 fL (ref 80.0–100.0)
Monocytes Absolute: 0.3 10*3/uL (ref 0.1–1.0)
Monocytes Relative: 8 %
NEUTROS ABS: 2.1 10*3/uL (ref 1.7–7.7)
NEUTROS PCT: 52 %
NRBC: 0 % (ref 0.0–0.2)
Platelet Count: 178 10*3/uL (ref 150–400)
RBC: 4.86 MIL/uL (ref 3.87–5.11)
RDW: 12.9 % (ref 11.5–15.5)
WBC Count: 4 10*3/uL (ref 4.0–10.5)

## 2018-04-29 NOTE — Progress Notes (Signed)
[place] Telephone:(336) [phone]   Fax:(336) [phone]  OFFICE PROGRESS NOTE  Patient, No Pcp Per No address on file  DIAGNOSIS: Gestational thrombocytopenia  PRIOR THERAPY: None  CURRENT THERAPY: Observation.  INTERVAL HISTORY: Denise Castillo 31 y.o. female returns to the clinic today for returns to the clinic today for follow-up visit accompanied by her husband.  The patient is feeling fine today with no concerning complaints.  She denied having any chest pain, shortness of breath, cough or hemoptysis.  She denied having any bleeding, bruises or ecchymosis.  She has no nausea, vomiting, diarrhea or constipation.  She has no significant weight loss or night sweats.  The patient is here today for evaluation with repeat CBC for evaluation of thrombocytopenia.  MEDICAL HISTORY: Past Medical History:  Diagnosis Date  . Iron deficiency anemia   . Seasonal allergies   . Uterine fibroid   . Wears contact lenses     ALLERGIES:  has No Known Allergies.  MEDICATIONS:  Current Outpatient Medications  Medication Sig Dispense Refill  . LO LOESTRIN FE 1 MG-10 MCG / 10 MCG tablet Take 1 tablet by mouth daily.  4   No current facility-administered medications for this visit.     SURGICAL HISTORY:  Past Surgical History:  Procedure Laterality Date  . CESAREAN SECTION N/A 11/30/2017   Procedure: CESAREAN SECTION;  Surgeon: Thurnell Lose, MD;  Location: [place];  Service: Obstetrics;  Laterality: N/A;  . LAPAROSCOPIC GELPORT ASSISTED MYOMECTOMY N/A 12/14/2015   Procedure: LAPAROSCOPIC GELPORT ASSISTED MYOMECTOMY;  Surgeon: Governor Specking, MD;  Location: [place];  Service: Gynecology;  Laterality: N/A;  . WRIST SURGERY      REVIEW OF SYSTEMS:  A comprehensive review of systems was negative.   PHYSICAL EXAMINATION: General appearance: alert, cooperative and no distress Head: Normocephalic, without obvious abnormality,  atraumatic Neck: no adenopathy, no JVD, supple, symmetrical, trachea midline and thyroid not enlarged, symmetric, no tenderness/mass/nodules Lymph nodes: Cervical, supraclavicular, and axillary nodes normal. Resp: clear to auscultation bilaterally Back: symmetric, no curvature. ROM normal. No CVA tenderness. Cardio: regular rate and rhythm, S1, S2 normal, no murmur, click, rub or gallop GI: soft, non-tender; bowel sounds normal; no masses,  no organomegaly Extremities: extremities normal, atraumatic, no cyanosis or edema  ECOG PERFORMANCE STATUS: 0 - Asymptomatic  Blood pressure 104/68, pulse 65, temperature 98.4 F (36.9 C), temperature source Oral, resp. rate 17, height 5\' 4"  (1.626 m), weight 170 lb 3.2 oz (77.2 kg), SpO2 100 %, unknown if currently breastfeeding.  LABORATORY DATA: Lab Results  Component Value Date   WBC 4.0 04/29/2018   HGB 14.2 04/29/2018   HCT 43.6 04/29/2018   MCV 89.7 04/29/2018   PLT 178 04/29/2018      Chemistry   No results found for: NA, K, CL, CO2, BUN, CREATININE, GLU No results found for: CALCIUM, ALKPHOS, AST, ALT, BILITOT     RADIOGRAPHIC STUDIES: No results found.  ASSESSMENT AND PLAN: This is a very pleasant 32 years old African-American female with history of gestational thrombocytopenia that is completely recovered at this point. She had repeat CBC performed earlier today.  I discussed the lab results with the patient and her husband and recommended for her to continue on observation with routine follow-up visit by her primary care physician at this point. I do not see a need for the patient to continue regular follow-up visit at the cancer center but I will be happy to see her in  the future. The patient was advised to call immediately if she has any concerning findings in the interval. The patient voices understanding of current disease status and treatment options and is in agreement with the current care plan.  All questions were  answered. The patient knows to call the clinic with any problems, questions or concerns. We can certainly see the patient much sooner if necessary.  I spent 10 minutes counseling the patient face to face. The total time spent in the appointment was 15 minutes.  Disclaimer: This note was dictated with voice recognition software. Similar sounding words can inadvertently be transcribed and may not be corrected upon review.

## 2018-07-05 ENCOUNTER — Ambulatory Visit: Payer: BLUE CROSS/BLUE SHIELD | Admitting: Internal Medicine

## 2018-07-05 ENCOUNTER — Encounter: Payer: Self-pay | Admitting: Internal Medicine

## 2018-07-05 VITALS — BP 112/60 | HR 75 | Temp 97.7°F | Ht 64.0 in | Wt 177.6 lb

## 2018-07-05 DIAGNOSIS — Z0000 Encounter for general adult medical examination without abnormal findings: Secondary | ICD-10-CM

## 2018-07-05 DIAGNOSIS — Z1389 Encounter for screening for other disorder: Secondary | ICD-10-CM

## 2018-07-05 DIAGNOSIS — D696 Thrombocytopenia, unspecified: Secondary | ICD-10-CM

## 2018-07-05 DIAGNOSIS — L309 Dermatitis, unspecified: Secondary | ICD-10-CM | POA: Insufficient documentation

## 2018-07-05 DIAGNOSIS — E559 Vitamin D deficiency, unspecified: Secondary | ICD-10-CM | POA: Insufficient documentation

## 2018-07-05 DIAGNOSIS — O99119 Other diseases of the blood and blood-forming organs and certain disorders involving the immune mechanism complicating pregnancy, unspecified trimester: Secondary | ICD-10-CM

## 2018-07-05 DIAGNOSIS — Z1329 Encounter for screening for other suspected endocrine disorder: Secondary | ICD-10-CM

## 2018-07-05 DIAGNOSIS — Z13220 Encounter for screening for lipoid disorders: Secondary | ICD-10-CM

## 2018-07-05 DIAGNOSIS — Z0184 Encounter for antibody response examination: Secondary | ICD-10-CM

## 2018-07-05 DIAGNOSIS — Z1159 Encounter for screening for other viral diseases: Secondary | ICD-10-CM

## 2018-07-05 MED ORDER — TRIAMCINOLONE ACETONIDE 0.1 % EX CREA: 1.0000 "application " | TOPICAL_CREAM | Freq: Two times a day (BID) | CUTANEOUS | 0 refills | Status: DC

## 2018-07-05 NOTE — Progress Notes (Signed)
Pre visit review using our clinic review tool, if applicable. No additional management support is needed unless otherwise documented below in the visit note. 

## 2018-07-05 NOTE — Patient Instructions (Signed)
Cetaphil/cerave cream   Eczema Eczema is a broad term for a group of skin conditions that cause skin to become rough and inflamed. Each type of eczema has different triggers, symptoms, and treatments. Eczema of any type is usually itchy and symptoms range from mild to severe. Eczema and its symptoms are not spread from person to person (are not contagious). It can appear on different parts of the body at different times. Your eczema may not look the same as someone else's eczema. What are the types of eczema? Atopic dermatitis This is a long-term (chronic) skin disease that keeps coming back (recurring). Usual symptoms are dry skin and small, solid pimples that may swell and leak fluid (weep). Contact dermatitis  This happens when something irritates the skin and causes a rash. The irritation can come from substances that you are allergic to (allergens), such as poison ivy, chemicals, or medicines that were applied to your skin. Dyshidrotic eczema This is a form of eczema on the hands and feet. It shows up as very itchy, fluid-filled blisters. It can affect people of any age, but is more common before age 70. Hand eczema  This causes very itchy areas of skin on the palms and sides of the hands and fingers. This type of eczema is common in industrial jobs where you may be exposed to many different types of irritants. Lichen simplex chronicus This type of eczema occurs when a person constantly scratches one area of the body. Repeated scratching of the area leads to thickened skin (lichenification). Lichen simplex chronicus can occur along with other types of eczema. It is more common in adults, but may be seen in children as well. Nummular eczema This is a common type of eczema. It has no known cause. It typically causes a red, circular, crusty lesion (plaque) that may be itchy. Scratching may become a habit and can cause bleeding. Nummular eczema occurs most often in people of middle-age or older.  It most often affects the hands. Seborrheic dermatitis This is a common skin disease that mainly affects the scalp. It may also affect any oily areas of the body, such as the face, sides of nose, eyebrows, ears, eyelids, and chest. It is marked by small scaling and redness of the skin (erythema). This can affect people of all ages. In infants, this condition is known as Chartered certified accountant." Stasis dermatitis This is a common skin disease that usually appears on the legs and feet. It most often occurs in people who have a condition that prevents blood from being pumped through the veins in the legs (chronic venous insufficiency). Stasis dermatitis is a chronic condition that needs long-term management. How is eczema diagnosed? Your health care provider will examine your skin and review your medical history. He or she may also give you skin patch tests. These tests involve taking patches that contain possible allergens and placing them on your back. He or she will then check in a few days to see if an allergic reaction occurred. What are the common treatments? Treatment for eczema is based on the type of eczema you have. Hydrocortisone steroid medicine can relieve itching quickly and help reduce inflammation. This medicine may be prescribed or obtained over-the-counter, depending on the strength of the medicine that is needed. Follow these instructions at home:  Take over-the-counter and prescription medicines only as told by your health care provider.  Use creams or ointments to moisturize your skin. Do not use lotions.  Learn what triggers or irritates your  symptoms. Avoid these things.  Treat symptom flare-ups quickly.  Do not itch your skin. This can make your rash worse.  Keep all follow-up visits as told by your health care provider. This is important. Where to find more information  The American Academy of Dermatology: http://jones-macias.info/  The National Eczema Association:  www.nationaleczema.org Contact a health care provider if:  You have serious itching, even with treatment.  You regularly scratch your skin until it bleeds.  Your rash looks different than usual.  Your skin is painful, swollen, or more red than usual.  You have a fever. Summary  There are eight general types of eczema. Each type has different triggers.  Eczema of any type causes itching that may range from mild to severe.  Treatment varies based on the type of eczema you have. Hydrocortisone steroid medicine can help with itching and inflammation.  Protecting your skin is the best way to prevent eczema. Use moisturizers and lotions. Avoid triggers and irritants, and treat flare-ups quickly. This information is not intended to replace advice given to you by your health care provider. Make sure you discuss any questions you have with your health care provider. Document Released: 10/05/2016 Document Revised: 10/05/2016 Document Reviewed: 10/05/2016 Elsevier Interactive Patient Education  2019 [employer].
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
## 2018-07-19 ENCOUNTER — Other Ambulatory Visit (INDEPENDENT_AMBULATORY_CARE_PROVIDER_SITE_OTHER): Payer: BLUE CROSS/BLUE SHIELD

## 2018-07-19 DIAGNOSIS — Z0184 Encounter for antibody response examination: Secondary | ICD-10-CM

## 2018-07-19 DIAGNOSIS — E559 Vitamin D deficiency, unspecified: Secondary | ICD-10-CM

## 2018-07-19 DIAGNOSIS — Z1389 Encounter for screening for other disorder: Secondary | ICD-10-CM | POA: Diagnosis not present

## 2018-07-19 DIAGNOSIS — Z13220 Encounter for screening for lipoid disorders: Secondary | ICD-10-CM

## 2018-07-19 DIAGNOSIS — Z1159 Encounter for screening for other viral diseases: Secondary | ICD-10-CM

## 2018-07-19 DIAGNOSIS — Z1329 Encounter for screening for other suspected endocrine disorder: Secondary | ICD-10-CM

## 2018-07-19 DIAGNOSIS — Z0000 Encounter for general adult medical examination without abnormal findings: Secondary | ICD-10-CM

## 2018-07-19 NOTE — Addendum Note (Signed)
Addended by: Arby Barrette on: 07/19/2018 07:59 AM   Modules accepted: Orders

## 2018-07-20 LAB — URINALYSIS, ROUTINE W REFLEX MICROSCOPIC
BILIRUBIN UA: NEGATIVE
GLUCOSE, UA: NEGATIVE
KETONES UA: NEGATIVE
Leukocytes, UA: NEGATIVE
NITRITE UA: NEGATIVE
Protein, UA: NEGATIVE
RBC UA: NEGATIVE
SPEC GRAV UA: 1.017 (ref 1.005–1.030)
UUROB: 0.2 mg/dL (ref 0.2–1.0)
pH, UA: 6.5 (ref 5.0–7.5)

## 2018-07-22 LAB — CBC WITH DIFFERENTIAL/PLATELET
ABSOLUTE MONOCYTES: 329 {cells}/uL (ref 200–950)
BASOS ABS: 31 {cells}/uL (ref 0–200)
Basophils Relative: 0.5 %
EOS ABS: 67 {cells}/uL (ref 15–500)
Eosinophils Relative: 1.1 %
HEMATOCRIT: 38.1 % (ref 35.0–45.0)
Hemoglobin: 12.7 g/dL (ref 11.7–15.5)
Lymphs Abs: 1031 cells/uL (ref 850–3900)
MCH: 29.6 pg (ref 27.0–33.0)
MCHC: 33.3 g/dL (ref 32.0–36.0)
MCV: 88.8 fL (ref 80.0–100.0)
MPV: 13.8 fL — ABNORMAL HIGH (ref 7.5–12.5)
Monocytes Relative: 5.4 %
Neutro Abs: 4642 cells/uL (ref 1500–7800)
Neutrophils Relative %: 76.1 %
Platelets: 117 10*3/uL — ABNORMAL LOW (ref 140–400)
RBC: 4.29 10*6/uL (ref 3.80–5.10)
RDW: 13.3 % (ref 11.0–15.0)
TOTAL LYMPHOCYTE: 16.9 %
WBC: 6.1 10*3/uL (ref 3.8–10.8)

## 2018-07-22 LAB — MEASLES/MUMPS/RUBELLA IMMUNITY
Mumps IgG: 187 AU/mL
RUBELLA: 6.78 {index}
Rubeola IgG: >300 AU/mL

## 2018-07-22 LAB — COMPREHENSIVE METABOLIC PANEL
AG RATIO: 1.2 (calc) (ref 1.0–2.5)
ALBUMIN MSPROF: 3.6 g/dL (ref 3.6–5.1)
ALT: 10 U/L (ref 6–29)
AST: 13 U/L (ref 10–30)
Alkaline phosphatase (APISO): 86 U/L (ref 31–125)
BUN: 10 mg/dL (ref 7–25)
CO2: 25 mmol/L (ref 20–32)
Calcium: 8.9 mg/dL (ref 8.6–10.2)
Chloride: 104 mmol/L (ref 98–110)
Creat: 0.7 mg/dL (ref 0.50–1.10)
GLUCOSE: 76 mg/dL (ref 65–99)
Globulin: 2.9 g/dL (calc) (ref 1.9–3.7)
POTASSIUM: 4.4 mmol/L (ref 3.5–5.3)
SODIUM: 137 mmol/L (ref 135–146)
TOTAL PROTEIN: 6.5 g/dL (ref 6.1–8.1)
Total Bilirubin: 0.5 mg/dL (ref 0.2–1.2)

## 2018-07-22 LAB — T4, FREE: FREE T4: 0.9 ng/dL (ref 0.8–1.8)

## 2018-07-22 LAB — TSH: TSH: 1.8 mIU/L

## 2018-07-22 LAB — LIPID PANEL
CHOLESTEROL: 123 mg/dL (ref <200)
HDL: 60 mg/dL (ref >=50)
LDL Cholesterol (Calc): 49 mg/dL (calc)
Non-HDL Cholesterol (Calc): 63 mg/dL (calc) (ref <130)
Total CHOL/HDL Ratio: 2.1 (calc) (ref <5.0)
Triglycerides: 62 mg/dL (ref <150)

## 2018-07-22 LAB — HEPATITIS B SURFACE ANTIBODY, QUANTITATIVE: Hepatitis B-Post: 758 m[IU]/mL (ref >=10)

## 2018-07-22 LAB — VITAMIN D 25 HYDROXY (VIT D DEFICIENCY, FRACTURES): VIT D 25 HYDROXY: 21 ng/mL — AB (ref 30–100)

## 2018-08-04 ENCOUNTER — Encounter: Payer: Self-pay | Admitting: Internal Medicine

## 2018-08-05 ENCOUNTER — Other Ambulatory Visit: Payer: Self-pay | Admitting: Internal Medicine

## 2018-08-06 ENCOUNTER — Encounter: Payer: Self-pay | Admitting: Internal Medicine

## 2018-08-06 NOTE — Telephone Encounter (Signed)
Sent to PCP ?

## 2018-08-07 ENCOUNTER — Telehealth: Payer: Self-pay | Admitting: Internal Medicine

## 2018-08-07 NOTE — Telephone Encounter (Signed)
Called patient per 3/3 VM scheduling log and scheduled an appt.  Patient aware of appt date and time.

## 2018-08-09 ENCOUNTER — Encounter: Payer: Self-pay | Admitting: Internal Medicine

## 2018-08-16 ENCOUNTER — Other Ambulatory Visit: Payer: Self-pay | Admitting: *Deleted

## 2018-08-16 DIAGNOSIS — D696 Thrombocytopenia, unspecified: Secondary | ICD-10-CM

## 2018-08-19 ENCOUNTER — Other Ambulatory Visit: Payer: Self-pay

## 2018-08-19 ENCOUNTER — Encounter: Payer: Self-pay | Admitting: Internal Medicine

## 2018-08-19 ENCOUNTER — Inpatient Hospital Stay: Payer: BLUE CROSS/BLUE SHIELD | Admitting: Internal Medicine

## 2018-08-19 ENCOUNTER — Inpatient Hospital Stay: Payer: BLUE CROSS/BLUE SHIELD | Attending: Internal Medicine

## 2018-08-19 DIAGNOSIS — D696 Thrombocytopenia, unspecified: Secondary | ICD-10-CM

## 2018-08-19 DIAGNOSIS — D6959 Other secondary thrombocytopenia: Secondary | ICD-10-CM

## 2018-08-19 LAB — CBC WITH DIFFERENTIAL (CANCER CENTER ONLY)
ABS IMMATURE GRANULOCYTES: 0.02 10*3/uL (ref 0.00–0.07)
Basophils Absolute: 0 10*3/uL (ref 0.0–0.1)
Basophils Relative: 1 %
EOS PCT: 4 %
Eosinophils Absolute: 0.2 10*3/uL (ref 0.0–0.5)
HEMATOCRIT: 43.3 % (ref 36.0–46.0)
HEMOGLOBIN: 13.6 g/dL (ref 12.0–15.0)
Immature Granulocytes: 0 %
Lymphocytes Relative: 30 %
Lymphs Abs: 1.8 10*3/uL (ref 0.7–4.0)
MCH: 29.4 pg (ref 26.0–34.0)
MCHC: 31.4 g/dL (ref 30.0–36.0)
MCV: 93.5 fL (ref 80.0–100.0)
MONO ABS: 0.5 10*3/uL (ref 0.1–1.0)
MONOS PCT: 8 %
NEUTROS PCT: 57 %
Neutro Abs: 3.5 10*3/uL (ref 1.7–7.7)
Platelet Count: 149 10*3/uL — ABNORMAL LOW (ref 150–400)
RBC: 4.63 MIL/uL (ref 3.87–5.11)
RDW: 14.3 % (ref 11.5–15.5)
WBC: 6.1 10*3/uL (ref 4.0–10.5)
nRBC: 0 % (ref 0.0–0.2)
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
## 2018-08-21 ENCOUNTER — Telehealth: Payer: Self-pay | Admitting: Internal Medicine

## 2018-08-21 NOTE — Telephone Encounter (Signed)
Per 3/16 los f/u as needed.

## 2018-10-21 ENCOUNTER — Encounter: Payer: Self-pay | Admitting: Family

## 2018-10-21 ENCOUNTER — Ambulatory Visit (INDEPENDENT_AMBULATORY_CARE_PROVIDER_SITE_OTHER): Payer: BLUE CROSS/BLUE SHIELD | Admitting: Family

## 2018-10-21 ENCOUNTER — Other Ambulatory Visit: Payer: Self-pay

## 2018-10-21 ENCOUNTER — Telehealth: Payer: Self-pay | Admitting: *Deleted

## 2018-10-21 ENCOUNTER — Ambulatory Visit: Payer: Self-pay

## 2018-10-21 DIAGNOSIS — Z20822 Contact with and (suspected) exposure to covid-19: Secondary | ICD-10-CM

## 2018-10-21 DIAGNOSIS — R0602 Shortness of breath: Secondary | ICD-10-CM | POA: Diagnosis not present

## 2018-10-21 DIAGNOSIS — Z20828 Contact with and (suspected) exposure to other viral communicable diseases: Secondary | ICD-10-CM | POA: Diagnosis not present

## 2018-10-21 NOTE — Patient Instructions (Addendum)
CT Angio of lungs for shortness of breath and to ensure NO pulmonary embolism. Although I suspect low likelihood based on your vitals at home today, however I want to be very cautious as you are on birth control, with recent delivery of your daughter.   Trial of albuterol ( which you have at home).   Use albuterol every 6 hours for first 24 hours to get good medication into the lungs and loosen congestion; after, you may use as needed and eventually stop all together when cough resolves.  We will be in touch in regards to CT Angio and also COVID testing for you.  Please let me know of any new or worsening symptoms.   Shortness of Breath, Adult Shortness of breath means you have trouble breathing. Shortness of breath could be a sign of a medical problem. Follow these instructions at home:   Watch for any changes in your symptoms.  Do not use any products that contain nicotine or tobacco, such as cigarettes, e-cigarettes, and chewing tobacco.  Do not smoke. Smoking can cause shortness of breath. If you need help to quit smoking, ask your doctor.  Avoid things that can make it harder to breathe, such as: ? Mold. ? Dust. ? Air pollution. ? Chemical smells. ? Things that can cause allergy symptoms (allergens), if you have allergies.  Keep your living space clean. Use products that help remove mold and dust.  Rest as needed. Slowly return to your normal activities.  Take over-the-counter and prescription medicines only as told by your doctor. This includes oxygen therapy and inhaled medicines.  Keep all follow-up visits as told by your doctor. This is important. Contact a doctor if:  Your condition does not get better as soon as expected.  You have a hard time doing your normal activities, even after you rest.  You have new symptoms. Get help right away if:  Your shortness of breath gets worse.  You have trouble breathing when you are resting.  You feel light-headed or you  pass out (faint).  You have a cough that is not helped by medicines.  You cough up blood.  You have pain with breathing.  You have pain in your chest, arms, shoulders, or belly (abdomen).  You have a fever.  You cannot walk up stairs.  You cannot exercise the way you normally do. These symptoms may represent a serious problem that is an emergency. Do not wait to see if the symptoms will go away. Get medical help right away. Call your local emergency services (911 in the [place] Do not drive yourself to the hospital. Summary  Shortness of breath is when you have trouble breathing enough air. It can be a sign of a medical problem.  Avoid things that make it hard for you to breathe, such as smoking, pollution, mold, and dust.  Watch for any changes in your symptoms. Contact your doctor if you do not get better or you get worse. This information is not intended to replace advice given to you by your health care provider. Make sure you discuss any questions you have with your health care provider. Document Released: 11/08/2007 Document Revised: 10/22/2017 Document Reviewed: 10/22/2017 Elsevier Interactive Patient Education  2019 [employer].

## 2018-10-21 NOTE — Telephone Encounter (Signed)
Symptoms: SOB, chills, cough, weakness, possible exposure to COVID  PCP requesting test for patient her husband is awaiting his test results and patient is having symptoms they would like her tested for.

## 2018-10-21 NOTE — Progress Notes (Signed)
I called & spoke with [place] nurse, Opal Sidles. She was called patient to get her set up for drive-thru testing.

## 2018-10-21 NOTE — Telephone Encounter (Signed)
Pt c/o occasional chills, occasional productive cough, pt stated that she feels like she has to stop and occasionally catch her breath. Pt c/o fatigue. Symptoms started last Wednesday and pt feels worse today than yesterday. Husband was tested for Covid and his results are pending. Pt is concerned about exposure to her 11 month old baby.  Care advice given and pt verbalized understanding. Pt transferred to office for appt.     Reason for Disposition . MILD difficulty breathing (e.g., minimal/no SOB at rest, SOB with walking, pulse <100)  Answer Assessment - Initial Assessment Questions 1. COVID-19 DIAGNOSIS: "Who made your Coronavirus (COVID-19) diagnosis?" "Was it confirmed by a positive lab test?" If not diagnosed by a HCP, ask "Are there lots of cases (community spread) where you live?" (See public health department website, if unsure)   * MAJOR community spread: high number of cases; numbers of cases are increasing; many people hospitalized.   * MINOR community spread: low number of cases; not increasing; few or no people hospitalized     minor 2. ONSET: "When did the COVID-19 symptoms start?"     Last Wednesday 3. WORST SYMPTOM: "What is your worst symptom?" (e.g., cough, fever, shortness of breath, muscle aches)     Shortness of breath 4. COUGH: "Do you have a cough?" If so, ask: "How bad is the cough?"       Occasional productive cough 5. FEVER: "Do you have a fever?" If so, ask: "What is your temperature, how was it measured, and when did it start?"    no 6. RESPIRATORY STATUS: "Describe your breathing?" (e.g., shortness of breath, wheezing, unable to speak)      Feels like she is having to catch her breath at rest or with exertion 7. BETTER-SAME-WORSE: "Are you getting better, staying the same or getting worse compared to yesterday?"  If getting worse, ask, "In what way?"     Worse- SOB-had chest pain that resolved like a quick sharp pain 8. HIGH RISK DISEASE: "Do you have any  chronic medical problems?" (e.g., asthma, heart or lung disease, weak immune system, etc.)    no 9. PREGNANCY: "Is there any chance you are pregnant?" "When was your last menstrual period?"    No-LMP may 2020 10. OTHER SYMPTOMS: "Do you have any other symptoms?"  (e.g., runny nose, headache, sore throat, loss of smell)       Fatigue  Protocols used: CORONAVIRUS (COVID-19) DIAGNOSED OR SUSPECTED-A-AH

## 2018-10-21 NOTE — Telephone Encounter (Signed)
noted 
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
## 2018-10-21 NOTE — Telephone Encounter (Signed)
Patient has complaint of SOB occasional "like I have to stop to catch my breath", has not had a fever yet but has had worsening symptoms for 5 days, has chills and productive cough. Advised  Patient she may not meet the criteria for testing, due to no fever no known exposure to patient DX with Covid, but her husband is under suspicion for Covid was tested last week still awaiting results , patient just wants advice on how to treat symptoms and what she can do  To protect her 60 month old . Advised should call pediatrician if child develops symptoms are for advice. Scheduled patient with NP for virtual.

## 2018-10-22 ENCOUNTER — Other Ambulatory Visit: Payer: BLUE CROSS/BLUE SHIELD

## 2018-10-22 ENCOUNTER — Ambulatory Visit
Admission: RE | Admit: 2018-10-22 | Discharge: 2018-10-22 | Disposition: A | Discharge status: Home / Self Care | Payer: BLUE CROSS/BLUE SHIELD | Source: Ambulatory Visit | Attending: Family | Admitting: Family

## 2018-10-22 ENCOUNTER — Ambulatory Visit: Payer: Self-pay | Admitting: Internal Medicine

## 2018-10-22 DIAGNOSIS — R0602 Shortness of breath: Secondary | ICD-10-CM

## 2018-10-22 DIAGNOSIS — Z20822 Contact with and (suspected) exposure to covid-19: Secondary | ICD-10-CM

## 2018-10-22 MED ORDER — IOHEXOL 350 MG/ML SOLN
75.0000 mL | Freq: Once | INTRAVENOUS | Status: AC | PRN
  Administered 2018-10-22: 15:00:00 75 mL via INTRAVENOUS

## 2018-10-22 NOTE — Telephone Encounter (Signed)
I'm supposed have COVID at 9:15am.  It's scheduled in [place] but I need it in [place]. Can it be changed to 9:30?  I checked and she is scheduled for the [facility] location for COVID-19 testing.   I changed the appt time from 9:15 to 9:30 at pt's request.

## 2018-10-23 ENCOUNTER — Other Ambulatory Visit: Payer: Self-pay | Admitting: Family

## 2018-10-23 DIAGNOSIS — R0602 Shortness of breath: Secondary | ICD-10-CM | POA: Insufficient documentation

## 2018-10-23 MED ORDER — ALBUTEROL SULFATE HFA 108 (90 BASE) MCG/ACT IN AERS: 2.0000 | INHALATION_SPRAY | Freq: Four times a day (QID) | RESPIRATORY_TRACT | 0 refills | Status: DC | PRN

## 2018-10-23 NOTE — Assessment & Plan Note (Signed)
Low risk based on wells criteria for PE however with abrupt onset of shortness of breath, and that she is transdermal OCP, we agreed to pursue CT Angio of chest. Pleased to see results are negative at time of this note. Reassured by sa02 and HR. No acute respiratory distress over phone. She is not labored in speech. Based on potential exposure, she will be tested for COVID. Advised to self quarantine.  Patient will let me know how she is doing.

## 2018-10-24 LAB — NOVEL CORONAVIRUS, NAA: SARS-CoV-2, NAA: NOT DETECTED

## 2018-12-11 ENCOUNTER — Telehealth: Payer: Self-pay

## 2018-12-11 NOTE — Telephone Encounter (Signed)
Okay. She needs to make est care appt

## 2018-12-11 NOTE — Telephone Encounter (Signed)
Sure  [employer]

## 2018-12-11 NOTE — Telephone Encounter (Signed)
Copied from [place] [id number]. Topic: General - Inquiry >> Dec 11, 2018  8:17 AM Scherrie Gerlach wrote: Reason for CRM: pt would like to switch to Mable Paris from Dr Linus Orn.  She say she worked with her through her covid issue and really liked Joycelyn Schmid. Is that OK?

## 2018-12-20 ENCOUNTER — Ambulatory Visit (INDEPENDENT_AMBULATORY_CARE_PROVIDER_SITE_OTHER): Payer: Self-pay | Admitting: Family

## 2018-12-20 ENCOUNTER — Other Ambulatory Visit: Payer: Self-pay

## 2018-12-20 ENCOUNTER — Encounter: Payer: Self-pay | Admitting: Family

## 2018-12-20 DIAGNOSIS — Z7689 Persons encountering health services in other specified circumstances: Secondary | ICD-10-CM | POA: Insufficient documentation

## 2018-12-20 DIAGNOSIS — D696 Thrombocytopenia, unspecified: Secondary | ICD-10-CM

## 2018-12-20 NOTE — Patient Instructions (Signed)
Stay safe!  Look forward to getting to know you!

## 2018-12-20 NOTE — Assessment & Plan Note (Signed)
Platelets near normal in March of this year.  Patient and I discussed that we would continue to follow at least annually.  Certainly if any significant decrease or patient becomes symptomatic, will refer her back to rheumatology, Dr. Earlie Server.  We will

## 2018-12-20 NOTE — Assessment & Plan Note (Signed)
Reviewed current and past medical history with patient today
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
## 2018-12-20 NOTE — Progress Notes (Signed)
Appt today is for transfer of care.  Former pt of Dr. Aundra Dubin.  Pt has no concerns today.

## 2018-12-30 ENCOUNTER — Encounter: Payer: Self-pay | Admitting: Family

## 2019-01-07 ENCOUNTER — Telehealth: Payer: Self-pay

## 2019-01-07 NOTE — Telephone Encounter (Signed)
Copied from [place] [id number]. Topic: Quick Communication - Appointment Cancellation >> Jan 07, 2019 12:14 PM Erick Blinks wrote: Patient called to cancel appointment scheduled for 01/10/2019. Patient has not rescheduled their appointment.  Route to department's PEC pool.

## 2019-01-07 NOTE — Telephone Encounter (Signed)
Appt for 01/10/19 cancelled.

## 2019-01-08 ENCOUNTER — Other Ambulatory Visit: Payer: Self-pay | Admitting: Obstetrics and Gynecology

## 2019-01-08 ENCOUNTER — Other Ambulatory Visit (HOSPITAL_COMMUNITY)
Admission: RE | Admit: 2019-01-08 | Discharge: 2019-01-08 | Disposition: A | Discharge status: Home / Self Care | Payer: BC Managed Care – PPO | Source: Ambulatory Visit | Attending: Obstetrics and Gynecology | Admitting: Obstetrics and Gynecology

## 2019-01-08 DIAGNOSIS — Z124 Encounter for screening for malignant neoplasm of cervix: Secondary | ICD-10-CM | POA: Insufficient documentation

## 2019-01-09 ENCOUNTER — Other Ambulatory Visit: Payer: Self-pay | Admitting: Obstetrics and Gynecology

## 2019-01-09 DIAGNOSIS — N6489 Other specified disorders of breast: Secondary | ICD-10-CM

## 2019-01-09 LAB — CYTOLOGY - PAP
Diagnosis: NEGATIVE
HPV: NOT DETECTED

## 2019-01-10 ENCOUNTER — Ambulatory Visit: Payer: BLUE CROSS/BLUE SHIELD | Admitting: Internal Medicine

## 2019-01-16 ENCOUNTER — Other Ambulatory Visit: Payer: Self-pay

## 2019-01-17 ENCOUNTER — Ambulatory Visit
Admission: RE | Admit: 2019-01-17 | Discharge: 2019-01-17 | Disposition: A | Discharge status: Home / Self Care | Payer: BC Managed Care – PPO | Source: Ambulatory Visit | Attending: Obstetrics and Gynecology | Admitting: Obstetrics and Gynecology

## 2019-01-17 ENCOUNTER — Other Ambulatory Visit: Payer: Self-pay

## 2019-01-17 DIAGNOSIS — N6489 Other specified disorders of breast: Secondary | ICD-10-CM

## 2019-08-29 ENCOUNTER — Other Ambulatory Visit: Payer: Self-pay

## 2019-08-29 ENCOUNTER — Telehealth: Payer: BC Managed Care – PPO | Admitting: Nurse Practitioner

## 2019-08-29 ENCOUNTER — Encounter: Payer: Self-pay | Admitting: Nurse Practitioner

## 2019-08-29 VITALS — Ht 64.0 in | Wt 178.0 lb

## 2019-08-29 DIAGNOSIS — J069 Acute upper respiratory infection, unspecified: Secondary | ICD-10-CM | POA: Insufficient documentation

## 2019-08-29 NOTE — Progress Notes (Signed)
Virtual Visit via Video Note  I connected with Denise Castillo on 08/29/19 at  8:00 AM EDT by a video enabled telemedicine application and verified that I am speaking with the correct person using two identifiers. This visit type was conducted due to national recommendations for restrictions regarding the COVID-19 Pandemic (e.g. social distancing).  This format is felt to be most appropriate for this patient at this time.   I discussed the limitations of evaluation and management by telemedicine and the availability of in person appointments. The patient expressed understanding and agreed to proceed.  Only the patient and myself were on today's video visit. The patient was at home and I was in my office at the time of today's visit.   History of Present Illness:  This 34 yo reports her DTR was sick last week with URI and has recovered. The pt had a sore throat and HA last week. Last weekend, she had a  barking cough with clear mucus and gagged when ate. She saw a MD in Urgent Care on Mon and tested NEG Covid. She was given [employer]. She was told to come back if sx worsened. Cough is present at night- mucus ins stuck in there. No longer having runny nose. She is taking Mucinex as directed. No fever/chills. No SOB/CP/wheezing. She is 50 % improved from last week. No hx of asthma, COPD.   Observations/Objective:  Gen: Awake, alert, no acute distress. She is well appearing.  Resp: Breathing is even and non-labored. No congestion tone to voice. No spontaneous coughing during conversation. When asked to demonstrate the cough- dry sounding cough. Psych: calm/pleasant demeanor Neuro: Alert and Oriented x 3, + facial symmetry, speech is clear.  Assessment and Plan:  Viral  URI with cough: Improving form last week.  Continue with your supportive care, rest, fluids, and take the Tessalon Perles at that time.  We discussed something stronger for bedtime but she has them at home and wants to be able to hear  her.  She does not have nasal congestion or pressure at this time.  I do not think Afrin will be useful.  She should increase hydration to help clear secretions and take Mucinex as directed.  Call back if not improving by Monday.  If she gets worse over the weekend proceeds to acute care for in person evaluation.  She was advised to watch for fever, wheezing, chest pain or shortness of breath.  Patient is comfortable with this plan.    Follow Up Instructions:    I discussed the assessment and treatment plan with the patient. The patient was provided an opportunity to ask questions and all were answered. The patient agreed with the plan and demonstrated an understanding of the instructions.   The patient was advised to call back or seek an in-person evaluation if the symptoms worsen or if the condition fails to improve as anticipated.    Denise Paradise, NP

## 2019-09-01 ENCOUNTER — Encounter: Payer: Self-pay | Admitting: Family

## 2019-09-04 ENCOUNTER — Ambulatory Visit: Payer: BC Managed Care – PPO

## 2020-05-22 ENCOUNTER — Encounter: Payer: Self-pay | Admitting: Family

## 2020-07-02 ENCOUNTER — Ambulatory Visit: Payer: BC Managed Care – PPO | Admitting: Family

## 2020-07-02 ENCOUNTER — Other Ambulatory Visit: Payer: Self-pay

## 2020-07-02 ENCOUNTER — Encounter: Payer: Self-pay | Admitting: Family

## 2020-07-02 VITALS — BP 94/62 | HR 93 | Temp 98.3°F | Ht 64.02 in | Wt 181.4 lb

## 2020-07-02 DIAGNOSIS — J302 Other seasonal allergic rhinitis: Secondary | ICD-10-CM | POA: Diagnosis not present

## 2020-07-02 LAB — CBC WITH DIFFERENTIAL/PLATELET
Basophils Absolute: 0 10*3/uL (ref 0.0–0.1)
Basophils Relative: 0.6 % (ref 0.0–3.0)
Eosinophils Absolute: 0.1 10*3/uL (ref 0.0–0.7)
Eosinophils Relative: 1.2 % (ref 0.0–5.0)
HCT: 39 % (ref 36.0–46.0)
Hemoglobin: 12.9 g/dL (ref 12.0–15.0)
Lymphocytes Relative: 17.8 % (ref 12.0–46.0)
Lymphs Abs: 1.2 10*3/uL (ref 0.7–4.0)
MCHC: 33 g/dL (ref 30.0–36.0)
MCV: 89.4 fl (ref 78.0–100.0)
Monocytes Absolute: 0.5 10*3/uL (ref 0.1–1.0)
Monocytes Relative: 8 % (ref 3.0–12.0)
Neutro Abs: 4.9 10*3/uL (ref 1.4–7.7)
Neutrophils Relative %: 72.4 % (ref 43.0–77.0)
Platelets: 134 10*3/uL — ABNORMAL LOW (ref 150.0–400.0)
RBC: 4.37 Mil/uL (ref 3.87–5.11)
RDW: 14.4 % (ref 11.5–15.5)
WBC: 6.8 10*3/uL (ref 4.0–10.5)

## 2020-07-02 LAB — COMPREHENSIVE METABOLIC PANEL
ALT: 11 U/L (ref 0–35)
AST: 13 U/L (ref 0–37)
Albumin: 4 g/dL (ref 3.5–5.2)
Alkaline Phosphatase: 88 U/L (ref 39–117)
BUN: 12 mg/dL (ref 6–23)
CO2: 33 mEq/L — ABNORMAL HIGH (ref 19–32)
Calcium: 9.5 mg/dL (ref 8.4–10.5)
Chloride: 103 mEq/L (ref 96–112)
Creatinine, Ser: 0.77 mg/dL (ref 0.40–1.20)
GFR: 99.42 mL/min (ref >60.00)
Glucose, Bld: 88 mg/dL (ref 70–99)
Potassium: 4 mEq/L (ref 3.5–5.1)
Sodium: 139 mEq/L (ref 135–145)
Total Bilirubin: 0.3 mg/dL (ref 0.2–1.2)
Total Protein: 7.2 g/dL (ref 6.0–8.3)

## 2020-07-02 LAB — VITAMIN D 25 HYDROXY (VIT D DEFICIENCY, FRACTURES): VITD: 50.79 ng/mL (ref 30.00–100.00)

## 2020-07-02 LAB — B12 AND FOLATE PANEL
Folate: 20 ng/mL (ref >5.9)
Vitamin B-12: 613 pg/mL (ref 211–911)

## 2020-07-02 LAB — TSH: TSH: 1.96 u[IU]/mL (ref 0.35–4.50)

## 2020-07-02 LAB — HEMOGLOBIN A1C: Hgb A1c MFr Bld: 5.6 % (ref 4.6–6.5)

## 2020-07-02 MED ORDER — AZELASTINE HCL 0.1 % NA SOLN: 1.0000 | Freq: Two times a day (BID) | NASAL | 4 refills | Status: DC

## 2020-07-02 NOTE — Assessment & Plan Note (Signed)
Symptoms resolved today. H/o atopy. Pending baseline lab evaluation. Trial of azelastine. If symptoms recur I have advised ENT consult with allergy testing. Close follow up.

## 2020-07-02 NOTE — Patient Instructions (Signed)
Trial of azelastine ( antihistamine)  If no improvement, we will consult ENT
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
## 2020-08-30 ENCOUNTER — Ambulatory Visit: Admission: RE | Admit: 2020-08-30 | Payer: BC Managed Care – PPO | Source: Ambulatory Visit

## 2020-08-30 ENCOUNTER — Ambulatory Visit: Payer: BC Managed Care – PPO | Admitting: Family

## 2020-08-30 ENCOUNTER — Telehealth: Payer: Self-pay | Admitting: Family

## 2020-08-30 ENCOUNTER — Other Ambulatory Visit: Payer: Self-pay

## 2020-08-30 ENCOUNTER — Encounter: Payer: Self-pay | Admitting: Family

## 2020-08-30 VITALS — BP 102/64 | HR 94 | Temp 97.9°F | Ht 64.0 in | Wt 185.8 lb

## 2020-08-30 DIAGNOSIS — M7989 Other specified soft tissue disorders: Secondary | ICD-10-CM | POA: Diagnosis not present

## 2020-08-30 DIAGNOSIS — D696 Thrombocytopenia, unspecified: Secondary | ICD-10-CM

## 2020-08-30 NOTE — Telephone Encounter (Signed)
Denise Castillo has called patient to reschedule [name]. Pt is scheduled tomorrow morning at 8:15.

## 2020-08-30 NOTE — Telephone Encounter (Signed)
noted 

## 2020-08-30 NOTE — Telephone Encounter (Signed)
Please advise 

## 2020-08-30 NOTE — Telephone Encounter (Signed)
Patient called back stated that she could not wait so she wants a referral for a ultra sound for her leg they were so back up

## 2020-08-30 NOTE — Assessment & Plan Note (Signed)
Unilateral. She is on Xulane and we agreed to operate with tremendous caution and rule out dvt. If negative, we agreed compression stocking, discretion with salt, elevation appropriate.

## 2020-08-30 NOTE — Patient Instructions (Signed)
Ultrasound today to rule blood clot.  compression stocking Use less salt and elevation   Let me know if doesn't improve.
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
## 2020-08-31 ENCOUNTER — Ambulatory Visit
Admission: RE | Admit: 2020-08-31 | Discharge: 2020-08-31 | Disposition: A | Discharge status: Home / Self Care | Payer: BC Managed Care – PPO | Source: Ambulatory Visit | Attending: Family | Admitting: Family

## 2020-08-31 DIAGNOSIS — M7989 Other specified soft tissue disorders: Secondary | ICD-10-CM | POA: Diagnosis present

## 2020-11-30 ENCOUNTER — Other Ambulatory Visit: Payer: BC Managed Care – PPO

## 2020-12-01 ENCOUNTER — Other Ambulatory Visit: Payer: Self-pay

## 2020-12-01 ENCOUNTER — Other Ambulatory Visit (INDEPENDENT_AMBULATORY_CARE_PROVIDER_SITE_OTHER): Payer: BC Managed Care – PPO

## 2020-12-01 DIAGNOSIS — D696 Thrombocytopenia, unspecified: Secondary | ICD-10-CM

## 2020-12-01 LAB — CBC WITH DIFFERENTIAL/PLATELET
Basophils Absolute: 0 10*3/uL (ref 0.0–0.1)
Basophils Relative: 0.7 % (ref 0.0–3.0)
Eosinophils Absolute: 0.1 10*3/uL (ref 0.0–0.7)
Eosinophils Relative: 1.2 % (ref 0.0–5.0)
HCT: 37.5 % (ref 36.0–46.0)
Hemoglobin: 12.5 g/dL (ref 12.0–15.0)
Lymphocytes Relative: 26.7 % (ref 12.0–46.0)
Lymphs Abs: 1.4 10*3/uL (ref 0.7–4.0)
MCHC: 33.3 g/dL (ref 30.0–36.0)
MCV: 88.8 fl (ref 78.0–100.0)
Monocytes Absolute: 0.4 10*3/uL (ref 0.1–1.0)
Monocytes Relative: 7.9 % (ref 3.0–12.0)
Neutro Abs: 3.3 10*3/uL (ref 1.4–7.7)
Neutrophils Relative %: 63.5 % (ref 43.0–77.0)
Platelets: 99 10*3/uL — ABNORMAL LOW (ref 150.0–400.0)
RBC: 4.22 Mil/uL (ref 3.87–5.11)
RDW: 13.6 % (ref 11.5–15.5)
WBC: 5.2 10*3/uL (ref 4.0–10.5)

## 2020-12-08 ENCOUNTER — Other Ambulatory Visit: Payer: Self-pay

## 2020-12-08 ENCOUNTER — Telehealth: Payer: Self-pay

## 2020-12-08 DIAGNOSIS — D696 Thrombocytopenia, unspecified: Secondary | ICD-10-CM

## 2020-12-08 NOTE — Telephone Encounter (Signed)
LMTCB for labs. 

## 2021-02-11 ENCOUNTER — Other Ambulatory Visit: Payer: BC Managed Care – PPO

## 2021-02-25 ENCOUNTER — Encounter: Payer: Self-pay | Admitting: Family

## 2021-02-25 ENCOUNTER — Other Ambulatory Visit: Payer: Self-pay

## 2021-02-25 ENCOUNTER — Other Ambulatory Visit (INDEPENDENT_AMBULATORY_CARE_PROVIDER_SITE_OTHER): Payer: BC Managed Care – PPO

## 2021-02-25 DIAGNOSIS — D696 Thrombocytopenia, unspecified: Secondary | ICD-10-CM

## 2021-02-25 LAB — CBC WITH DIFFERENTIAL/PLATELET
Basophils Absolute: 0.1 10*3/uL (ref 0.0–0.1)
Basophils Relative: 0.6 % (ref 0.0–3.0)
Eosinophils Absolute: 0.1 10*3/uL (ref 0.0–0.7)
Eosinophils Relative: 0.6 % (ref 0.0–5.0)
HCT: 37.9 % (ref 36.0–46.0)
Hemoglobin: 12.6 g/dL (ref 12.0–15.0)
Lymphocytes Relative: 11.2 % — ABNORMAL LOW (ref 12.0–46.0)
Lymphs Abs: 1.2 10*3/uL (ref 0.7–4.0)
MCHC: 33.3 g/dL (ref 30.0–36.0)
MCV: 89.4 fl (ref 78.0–100.0)
Monocytes Absolute: 0.8 10*3/uL (ref 0.1–1.0)
Monocytes Relative: 7.2 % (ref 3.0–12.0)
Neutro Abs: 8.7 10*3/uL — ABNORMAL HIGH (ref 1.4–7.7)
Neutrophils Relative %: 80.4 % — ABNORMAL HIGH (ref 43.0–77.0)
Platelets: 106 10*3/uL — ABNORMAL LOW (ref 150.0–400.0)
RBC: 4.23 Mil/uL (ref 3.87–5.11)
RDW: 13.6 % (ref 11.5–15.5)
WBC: 10.9 10*3/uL — ABNORMAL HIGH (ref 4.0–10.5)

## 2021-02-28 ENCOUNTER — Other Ambulatory Visit: Payer: Self-pay | Admitting: Family

## 2021-02-28 DIAGNOSIS — R7989 Other specified abnormal findings of blood chemistry: Secondary | ICD-10-CM

## 2021-03-11 ENCOUNTER — Other Ambulatory Visit: Payer: BC Managed Care – PPO

## 2021-04-01 ENCOUNTER — Other Ambulatory Visit: Payer: Self-pay

## 2021-04-01 ENCOUNTER — Other Ambulatory Visit (INDEPENDENT_AMBULATORY_CARE_PROVIDER_SITE_OTHER): Payer: BC Managed Care – PPO

## 2021-04-01 DIAGNOSIS — R7989 Other specified abnormal findings of blood chemistry: Secondary | ICD-10-CM | POA: Diagnosis not present

## 2021-04-01 LAB — CBC WITH DIFFERENTIAL/PLATELET
Basophils Absolute: 0 10*3/uL (ref 0.0–0.1)
Basophils Relative: 0.6 % (ref 0.0–3.0)
Eosinophils Absolute: 0.1 10*3/uL (ref 0.0–0.7)
Eosinophils Relative: 1.4 % (ref 0.0–5.0)
HCT: 37.2 % (ref 36.0–46.0)
Hemoglobin: 12.1 g/dL (ref 12.0–15.0)
Lymphocytes Relative: 27.6 % (ref 12.0–46.0)
Lymphs Abs: 1.4 10*3/uL (ref 0.7–4.0)
MCHC: 32.6 g/dL (ref 30.0–36.0)
MCV: 89 fl (ref 78.0–100.0)
Monocytes Absolute: 0.4 10*3/uL (ref 0.1–1.0)
Monocytes Relative: 7.7 % (ref 3.0–12.0)
Neutro Abs: 3.2 10*3/uL (ref 1.4–7.7)
Neutrophils Relative %: 62.7 % (ref 43.0–77.0)
Platelets: 115 10*3/uL — ABNORMAL LOW (ref 150.0–400.0)
RBC: 4.18 Mil/uL (ref 3.87–5.11)
RDW: 14.2 % (ref 11.5–15.5)
WBC: 5.1 10*3/uL (ref 4.0–10.5)

## 2021-05-04 ENCOUNTER — Telehealth: Payer: Self-pay | Admitting: Family

## 2021-05-04 NOTE — Telephone Encounter (Signed)
Patient was told to return for labs in 6 months. However, there are no lab orders in the system,

## 2021-05-04 NOTE — Telephone Encounter (Signed)
Cbc with diff, sch in 6 months Thank you!

## 2021-05-05 ENCOUNTER — Other Ambulatory Visit: Payer: Self-pay

## 2021-05-05 DIAGNOSIS — D696 Thrombocytopenia, unspecified: Secondary | ICD-10-CM

## 2021-05-05 NOTE — Telephone Encounter (Signed)
Order placed & patient aware with appointment in April.

## 2021-08-25 ENCOUNTER — Encounter: Payer: Self-pay | Admitting: Family

## 2021-08-26 ENCOUNTER — Telehealth: Payer: BC Managed Care – PPO | Admitting: Internal Medicine

## 2021-08-26 ENCOUNTER — Encounter: Payer: Self-pay | Admitting: Family

## 2021-08-26 ENCOUNTER — Telehealth (INDEPENDENT_AMBULATORY_CARE_PROVIDER_SITE_OTHER): Payer: BC Managed Care – PPO | Admitting: Family

## 2021-08-26 VITALS — Ht 64.0 in

## 2021-08-26 DIAGNOSIS — J40 Bronchitis, not specified as acute or chronic: Secondary | ICD-10-CM | POA: Diagnosis not present

## 2021-08-26 DIAGNOSIS — D696 Thrombocytopenia, unspecified: Secondary | ICD-10-CM | POA: Diagnosis not present

## 2021-08-26 MED ORDER — PREDNISONE 10 MG PO TABS: ORAL_TABLET | ORAL | 0 refills | Status: DC

## 2021-08-26 NOTE — Assessment & Plan Note (Signed)
Pending cbc . Patient will schedule in 8 weeks ?

## 2021-08-26 NOTE — Progress Notes (Signed)
Virtual Visit via Video Note ? ?I connected with@ ? on 08/26/21 at 11:15 AM EDT by a video enabled telemedicine application and verified that I am speaking with the correct person using two identifiers. ? Location patient: home ?Location provider:work  ?Persons participating in the virtual visit: patient, provider ? ?I discussed the limitations of evaluation and management by telemedicine and the availability of in person appointments. The patient expressed understanding and agreed to proceed. ? ? ?HPI: ?Acute visit ?Complains of cough and chest congestion x 2 weeks ago, unchanged. Coughing throughout the day.   Endorses nasal congestion, fatigue ( which is improving) .  She feels the congestion in her chest but is unable to fully this up ?No fever, wheezing, sore throat, CP.  ?Seen at urgent care last week, finished zpak 4 days ago; flu and covid negative ?Mucinex DM , humidifier which is helpful for cough ?Daughter has sore throat ?No h/o asthma, non smoker ?She has taken prednisone in the past without side effects ? ?ROS: See pertinent positives and negatives per HPI. ? ? ? ?EXAM: ? ?VITALS per patient if applicable: ?Ht '5\' 4"'$  (1.626 m)   BMI 31.89 kg/m?  ?BP Readings from Last 3 Encounters:  ?08/30/20 102/64  ?07/02/20 94/62  ?08/19/18 (!) 110/51  ? ?Wt Readings from Last 3 Encounters:  ?08/30/20 185 lb 12.8 oz (84.3 kg)  ?07/02/20 181 lb 6.4 oz (82.3 kg)  ?08/29/19 178 lb (80.7 kg)  ? ? ?GENERAL: alert, oriented, appears well and in no acute distress ? ?HEENT: atraumatic, conjunttiva clear, no obvious abnormalities on inspection of external nose and ears ? ?NECK: normal movements of the head and neck ? ?LUNGS: on inspection no signs of respiratory distress, breathing rate appears normal, no obvious gross SOB, gasping or wheezing ? ?CV: no obvious cyanosis ? ?MS: moves all visible extremities without noticeable abnormality ? ?PSYCH/NEURO: pleasant and cooperative, no obvious depression or anxiety, speech and  thought processing grossly intact ? ?ASSESSMENT AND PLAN: ? ?Discussed the following assessment and plan: ? ?Problem List Items Addressed This Visit   ? ?  ? Respiratory  ? Bronchitis - Primary  ?  Afebrile. Norespiratory distress.  Completed Z-Pak 4 days ago.  Discussed less likely bacterial URI, perhaps lingering effects of previous bacterial URI or viral URI.  She has done well on prednisone in the past.  Short course of prednisone taper.We also considered symbicort.  She will continue Mucinex DM.  Politely declines chest x-ray at this time.  She will let me know how she is doing and if symptoms do not completely resolve ?  ?  ? Relevant Medications  ? predniSONE (DELTASONE) 10 MG tablet  ?  ? Hematopoietic and Hemostatic  ? Thrombocytopenia ([place])  ?  Pending cbc . Patient will schedule in 8 weeks ?  ?  ? Relevant Orders  ? CBC with Differential/Platelet  ? ? ?-we discussed possible serious and likely etiologies, options for evaluation and workup, limitations of telemedicine visit vs in person visit, treatment, treatment risks and precautions. Pt prefers to treat via telemedicine empirically rather then risking or undertaking an in person visit at this moment.  ?. ?  ?I discussed the assessment and treatment plan with the patient. The patient was provided an opportunity to ask questions and all were answered. The patient agreed with the plan and demonstrated an understanding of the instructions. ?  ?The patient was advised to call back or seek an in-person evaluation if the symptoms worsen or if  the condition fails to improve as anticipated. ? ? ?Mable Paris, FNP  ?

## 2021-08-26 NOTE — Telephone Encounter (Signed)
Patient scheduled today at 11:15.  ?

## 2021-08-26 NOTE — Patient Instructions (Signed)
Start prednisone prior to 2 PM today as discussed as it can interfere with sleep. ?Please let me know if symptoms do not completely resolve ? ? ?

## 2021-08-26 NOTE — Assessment & Plan Note (Signed)
Afebrile. Norespiratory distress.  Completed Z-Pak 4 days ago.  Discussed less likely bacterial URI, perhaps lingering effects of previous bacterial URI or viral URI.  She has done well on prednisone in the past.  Short course of prednisone taper.We also considered symbicort.  She will continue Mucinex DM.  Politely declines chest x-ray at this time.  She will let me know how she is doing and if symptoms do not completely resolve ?

## 2021-09-30 ENCOUNTER — Other Ambulatory Visit (INDEPENDENT_AMBULATORY_CARE_PROVIDER_SITE_OTHER): Payer: BC Managed Care – PPO

## 2021-09-30 DIAGNOSIS — D696 Thrombocytopenia, unspecified: Secondary | ICD-10-CM | POA: Diagnosis not present

## 2021-09-30 LAB — CBC WITH DIFFERENTIAL/PLATELET
Basophils Absolute: 0 10*3/uL (ref 0.0–0.1)
Basophils Relative: 0.8 % (ref 0.0–3.0)
Eosinophils Absolute: 0.1 10*3/uL (ref 0.0–0.7)
Eosinophils Relative: 1.8 % (ref 0.0–5.0)
HCT: 37.3 % (ref 36.0–46.0)
Hemoglobin: 12.5 g/dL (ref 12.0–15.0)
Lymphocytes Relative: 26.9 % (ref 12.0–46.0)
Lymphs Abs: 1.4 10*3/uL (ref 0.7–4.0)
MCHC: 33.4 g/dL (ref 30.0–36.0)
MCV: 88.6 fl (ref 78.0–100.0)
Monocytes Absolute: 0.4 10*3/uL (ref 0.1–1.0)
Monocytes Relative: 7 % (ref 3.0–12.0)
Neutro Abs: 3.3 10*3/uL (ref 1.4–7.7)
Neutrophils Relative %: 63.5 % (ref 43.0–77.0)
Platelets: 108 10*3/uL — ABNORMAL LOW (ref 150.0–400.0)
RBC: 4.21 Mil/uL (ref 3.87–5.11)
RDW: 13.7 % (ref 11.5–15.5)
WBC: 5.2 10*3/uL (ref 4.0–10.5)

## 2021-11-08 ENCOUNTER — Encounter: Payer: Self-pay | Admitting: Family

## 2021-11-09 ENCOUNTER — Encounter: Payer: Self-pay | Admitting: Family

## 2021-11-09 ENCOUNTER — Ambulatory Visit: Payer: BC Managed Care – PPO | Admitting: Family

## 2021-11-09 VITALS — BP 128/76 | HR 89 | Temp 97.7°F | Ht 64.0 in | Wt 190.0 lb

## 2021-11-09 DIAGNOSIS — D696 Thrombocytopenia, unspecified: Secondary | ICD-10-CM | POA: Diagnosis not present

## 2021-11-09 DIAGNOSIS — M62838 Other muscle spasm: Secondary | ICD-10-CM

## 2021-11-09 MED ORDER — PREDNISONE 10 MG PO TABS: ORAL_TABLET | ORAL | 0 refills | Status: DC

## 2021-11-09 MED ORDER — CYCLOBENZAPRINE HCL 10 MG PO TABS: 10.0000 mg | ORAL_TABLET | Freq: Every evening | ORAL | 0 refills | Status: DC | PRN

## 2021-11-09 NOTE — Progress Notes (Signed)
Dull aching pain on left side of neck,head and shoulder. Constant did not fall ,been going on for last week and a half. Taking tylenol but not really helping

## 2021-11-09 NOTE — Assessment & Plan Note (Addendum)
Presentation is consistent with muscle spasm, perhaps from weight lifting  And/or carrying young daughter.   We jointly agreed cervical x-ray, left shoulder x-ray  would unlikely change our initial treatment course at this time.  Due to history of thrombocytopenia, we opted not to use anti-inflammatory such as Mobic.   Start Flexeril '10mg'$  at bedtime.  I also sent in a prednisone taper for her to start if needed in the next couple days.  Counseled her on side effects of prednisone including insomnia and she understands take this medication in the morning only.  Encouraged her to use heat.  She will let me know how she is doing
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
## 2022-02-09 ENCOUNTER — Other Ambulatory Visit: Payer: BC Managed Care – PPO

## 2022-02-17 ENCOUNTER — Other Ambulatory Visit (INDEPENDENT_AMBULATORY_CARE_PROVIDER_SITE_OTHER): Payer: BC Managed Care – PPO

## 2022-02-17 DIAGNOSIS — D696 Thrombocytopenia, unspecified: Secondary | ICD-10-CM

## 2022-02-17 LAB — CBC WITH DIFFERENTIAL/PLATELET
Basophils Absolute: 0 10*3/uL (ref 0.0–0.1)
Basophils Relative: 0.9 % (ref 0.0–3.0)
Eosinophils Absolute: 0.1 10*3/uL (ref 0.0–0.7)
Eosinophils Relative: 1.9 % (ref 0.0–5.0)
HCT: 37.6 % (ref 36.0–46.0)
Hemoglobin: 12.5 g/dL (ref 12.0–15.0)
Lymphocytes Relative: 26.4 % (ref 12.0–46.0)
Lymphs Abs: 1.3 10*3/uL (ref 0.7–4.0)
MCHC: 33.2 g/dL (ref 30.0–36.0)
MCV: 89.7 fl (ref 78.0–100.0)
Monocytes Absolute: 0.4 10*3/uL (ref 0.1–1.0)
Monocytes Relative: 7.8 % (ref 3.0–12.0)
Neutro Abs: 3 10*3/uL (ref 1.4–7.7)
Neutrophils Relative %: 63 % (ref 43.0–77.0)
Platelets: 108 10*3/uL — ABNORMAL LOW (ref 150.0–400.0)
RBC: 4.19 Mil/uL (ref 3.87–5.11)
RDW: 13.3 % (ref 11.5–15.5)
WBC: 4.8 10*3/uL (ref 4.0–10.5)

## 2022-03-24 ENCOUNTER — Encounter: Payer: Self-pay | Admitting: Internal Medicine

## 2022-03-24 ENCOUNTER — Ambulatory Visit: Payer: BC Managed Care – PPO | Admitting: Internal Medicine

## 2022-03-24 DIAGNOSIS — J069 Acute upper respiratory infection, unspecified: Secondary | ICD-10-CM
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
## 2022-03-24 NOTE — Assessment & Plan Note (Signed)
Seems to be self limited viral infection Prior sinus infection did seem to clear completely--so this seems to be separate Discussed symptom relief--mostly tylenol and prn cough meds If worsens next week, will treat with augmentin

## 2022-05-12 ENCOUNTER — Ambulatory Visit: Payer: BC Managed Care – PPO | Admitting: Family

## 2022-05-23 ENCOUNTER — Encounter: Payer: Self-pay | Admitting: Family

## 2022-05-23 ENCOUNTER — Ambulatory Visit: Payer: BC Managed Care – PPO | Admitting: Family

## 2022-05-23 VITALS — BP 120/72 | HR 80 | Temp 98.1°F | Resp 14 | Ht 64.0 in | Wt 189.2 lb

## 2022-05-23 DIAGNOSIS — M62838 Other muscle spasm: Secondary | ICD-10-CM | POA: Diagnosis not present

## 2022-05-23 DIAGNOSIS — D696 Thrombocytopenia, unspecified: Secondary | ICD-10-CM | POA: Diagnosis not present

## 2022-05-23 DIAGNOSIS — M7989 Other specified soft tissue disorders: Secondary | ICD-10-CM

## 2022-05-23 NOTE — Assessment & Plan Note (Signed)
Resolved

## 2022-05-23 NOTE — Assessment & Plan Note (Signed)
Pending cbc , due 07/2022.

## 2022-05-23 NOTE — Assessment & Plan Note (Signed)
Chronic, stable. No edema on exam.  Discussed left leg size likely being her baseline.  She will stay vigilant in regards to erythema, leg pain ,shortness of breath.  Negative DVT left leg 08/2020.  Patient politely declines repeat imaging at this time which I agree would not be necessary.

## 2022-05-23 NOTE — Progress Notes (Signed)
Assessment & Plan:  Thrombocytopenia [facility]) Assessment & Plan: Pending cbc , due 07/2022.    Orders: -     CBC with Differential/Platelet; Future  Muscle spasm Assessment & Plan: Resolved.    Leg swelling Assessment & Plan: Chronic, stable. No edema on exam.  Discussed left leg size likely being her baseline.  She will stay vigilant in regards to erythema, leg pain ,shortness of breath.  Negative DVT left leg 08/2020.  Patient politely declines repeat imaging at this time which I agree would not be necessary.      Return precautions given.   Risks, benefits, and alternatives of the medications and treatment plan prescribed today were discussed, and patient expressed understanding.   Education regarding symptom management and diagnosis given to patient on AVS either electronically or printed.  Return for Follow Up Chronic Management, sch 01/2022.  Mable Paris, FNP  Subjective:    Patient ID: Denise Castillo, female    DOB: [birth year], 36 y.o.   MRN: [id number]  CC: Denise Castillo is a 36 y.o. female who presents today for follow up.   HPI: Feels well today No  new complaints  Left leg calf continues to be bigger than right. This has been ongoing and unchanged for years. No pain in leg, redness,sob No recent surgery No h/o cancer.  She remains very active on her Peloton bike  She is on OCP.    Discussed back pain, muscle spasm at previous visit.  Back pain has resolved. She neve used Flexeril.   Allergies: Patient has no known allergies. Current Outpatient Medications on File Prior to Visit  Medication Sig Dispense Refill   cyclobenzaprine (FLEXERIL) 10 MG tablet Take 1 tablet (10 mg total) by mouth at bedtime as needed for muscle spasms. 30 tablet 0   Multiple Vitamins-Minerals (WOMENS MULTIVITAMIN PO) Take 1 tablet by mouth daily.     XULANE 150-35 MCG/24HR transdermal patch      No current facility-administered medications on file prior to visit.     Review of Systems  Constitutional:  Negative for chills and fever.  Respiratory:  Negative for cough.   Cardiovascular:  Negative for chest pain, palpitations and leg swelling.  Gastrointestinal:  Negative for nausea and vomiting.      Objective:    BP 120/72 (BP Location: Left Arm, Patient Position: Sitting, Cuff Size: Large)   Pulse 80   Temp 98.1 F (36.7 C) (Temporal)   Resp 14   Ht '5\' 4"'$  (1.626 m)   Wt 189 lb 3.2 oz (85.8 kg)   SpO2 97%   BMI 32.48 kg/m  BP Readings from Last 3 Encounters:  05/23/22 120/72  03/24/22 100/60  11/09/21 128/76   Wt Readings from Last 3 Encounters:  05/23/22 189 lb 3.2 oz (85.8 kg)  03/24/22 189 lb (85.7 kg)  11/09/21 190 lb (86.2 kg)    Physical Exam Vitals reviewed.  Constitutional:      Appearance: She is well-developed.  Eyes:     Conjunctiva/sclera: Conjunctivae normal.  Cardiovascular:     Rate and Rhythm: Normal rate and regular rhythm.     Pulses: Normal pulses.     Heart sounds: Normal heart sounds.     Comments: No LE edema, palpable cords or masses. No erythema or increased warmth. Slight asymmetry in calf size when compared bilaterally, R> L.   LE hair growth symmetric and present. No discoloration or varicosities noted. LE warm and palpable pedal pulses.  Pulmonary:     Effort:  Pulmonary effort is normal.     Breath sounds: Normal breath sounds. No wheezing, rhonchi or rales.  Skin:    General: Skin is warm and dry.  Neurological:     Mental Status: She is alert.  Psychiatric:        Speech: Speech normal.        Behavior: Behavior normal.        Thought Content: Thought content normal.

## 2022-05-24 ENCOUNTER — Ambulatory Visit: Payer: BC Managed Care – PPO | Admitting: Family

## 2022-06-06 ENCOUNTER — Encounter: Payer: Self-pay | Admitting: Family

## 2022-07-24 ENCOUNTER — Other Ambulatory Visit: Payer: BC Managed Care – PPO

## 2022-07-28 ENCOUNTER — Encounter: Payer: Self-pay | Admitting: Family

## 2022-07-28 ENCOUNTER — Ambulatory Visit: Payer: BC Managed Care – PPO | Admitting: Family

## 2022-07-28 VITALS — BP 120/76 | HR 87 | Temp 97.7°F | Ht 64.0 in | Wt 183.8 lb

## 2022-07-28 DIAGNOSIS — R5383 Other fatigue: Secondary | ICD-10-CM | POA: Diagnosis not present

## 2022-07-28 DIAGNOSIS — R140 Abdominal distension (gaseous): Secondary | ICD-10-CM | POA: Diagnosis not present

## 2022-07-28 LAB — CBC WITH DIFFERENTIAL/PLATELET: RBC: 4.23 10*6/uL (ref 3.80–5.10)

## 2022-07-28 NOTE — Patient Instructions (Signed)
Consider trial stop dairy products for a week or 2 to see if abdominal bloating improves. Particularly after greasy foods, if you have vomiting or abdominal bloating episode, I would consider acid reflux to be playing a role.  You may start over-the-counter Pepcid AC 20 mg.  I have ordered transvaginal and abdominal ultrasound  Let us know if you dont hear back within a week in regards to an appointment being scheduled.   So that you are aware, if you are [facility] MyChart user , please pay attention to your MyChart messages as you may receive a MyChart message with a phone number to call and schedule this test/appointment own your own from our referral coordinator. This is a new process so I do not want you to miss this message.  If you are not a MyChart user, you will receive a phone call.

## 2022-07-28 NOTE — Progress Notes (Unsigned)
   Assessment & Plan:  There are no diagnoses linked to this encounter.   Return precautions given.   Risks, benefits, and alternatives of the medications and treatment plan prescribed today were discussed, and patient expressed understanding.   Education regarding symptom management and diagnosis given to patient on AVS either electronically or printed.  No follow-ups on file.  Mable Paris, FNP  Subjective:    Patient ID: Aalexis Libbert, female    DOB: 1986-01-23, 37 y.o.   MRN: [id number]  CC: Tiffeney Kurkowski is a 37 y.o. female who presents today for follow up.   HPI: Complains of episodic fatigue for year, comes and goes.   She is taking vit D gummy 2000 IU.   No fever, unusual weight loss.   53 year old daughter  Sleeping well. Sleep is restorative. She doesn't snore.   No depression. Working out 3 days per week with improvement of fatigue  She complains of abdominal bloating for years.  Not present in the morning, occurs after she eats. Not r/t menses.   One episode one month ago in which vomited over the course of 2-3 days after eating fried or greasy foods  She is not lactose intolerant  No blood in stool, unintentional weight loss, constipation, fever, excessive burping or belching.   H/o uterine fibroids.            Allergies: Patient has no known allergies. Current Outpatient Medications on File Prior to Visit  Medication Sig Dispense Refill   cyclobenzaprine (FLEXERIL) 10 MG tablet Take 1 tablet (10 mg total) by mouth at bedtime as needed for muscle spasms. 30 tablet 0   Multiple Vitamins-Minerals (WOMENS MULTIVITAMIN PO) Take 1 tablet by mouth daily.     XULANE 150-35 MCG/24HR transdermal patch      No current facility-administered medications on file prior to visit.    Review of Systems    Objective:    BP 120/76   Pulse 87   Temp 97.7 F (36.5 C) (Oral)   Ht 5' 4"$  (1.626 m)   Wt 183 lb 12.8 oz (83.4 kg)   LMP  (LMP Unknown)    SpO2 98%   BMI 31.55 kg/m  BP Readings from Last 3 Encounters:  07/28/22 120/76  05/23/22 120/72  03/24/22 100/60   Wt Readings from Last 3 Encounters:  07/28/22 183 lb 12.8 oz (83.4 kg)  05/23/22 189 lb 3.2 oz (85.8 kg)  03/24/22 189 lb (85.7 kg)    Physical Exam

## 2022-07-29 LAB — COMPREHENSIVE METABOLIC PANEL
BUN: 13 mg/dL (ref 7–25)
Calcium: 9.5 mg/dL (ref 8.6–10.2)
Potassium: 4.3 mmol/L (ref 3.5–5.3)
Total Bilirubin: 0.2 mg/dL (ref 0.2–1.2)

## 2022-07-29 LAB — URINALYSIS, ROUTINE W REFLEX MICROSCOPIC
Bilirubin Urine: NEGATIVE
Glucose, UA: NEGATIVE
Hgb urine dipstick: NEGATIVE
Ketones, ur: NEGATIVE
Leukocytes,Ua: NEGATIVE
Nitrite: NEGATIVE
Protein, ur: NEGATIVE
Specific Gravity, Urine: 1.019 (ref 1.001–1.035)
pH: 7.5 (ref 5.0–8.0)

## 2022-07-31 DIAGNOSIS — R5383 Other fatigue: Secondary | ICD-10-CM | POA: Insufficient documentation

## 2022-07-31 LAB — CBC WITH DIFFERENTIAL/PLATELET
Absolute Monocytes: 531 cells/uL (ref 200–950)
Basophils Absolute: 38 cells/uL (ref 0–200)
Basophils Relative: 0.6 %
Eosinophils Absolute: 102 cells/uL (ref 15–500)
Eosinophils Relative: 1.6 %
HCT: 37.4 % (ref 35.0–45.0)
Hemoglobin: 12.6 g/dL (ref 11.7–15.5)
Lymphs Abs: 1574 cells/uL (ref 850–3900)
MCH: 29.8 pg (ref 27.0–33.0)
MCHC: 33.7 g/dL (ref 32.0–36.0)
MCV: 88.4 fL (ref 80.0–100.0)
MPV: 14 fL — ABNORMAL HIGH (ref 7.5–12.5)
Monocytes Relative: 8.3 %
Neutro Abs: 4154 cells/uL (ref 1500–7800)
Neutrophils Relative %: 64.9 %
Platelets: 145 10*3/uL (ref 140–400)
RDW: 12.5 % (ref 11.0–15.0)
Total Lymphocyte: 24.6 %
WBC: 6.4 10*3/uL (ref 3.8–10.8)

## 2022-07-31 LAB — COMPREHENSIVE METABOLIC PANEL
AG Ratio: 1.4 (calc) (ref 1.0–2.5)
ALT: 11 U/L (ref 6–29)
AST: 15 U/L (ref 10–30)
Albumin: 4.1 g/dL (ref 3.6–5.1)
Alkaline phosphatase (APISO): 71 U/L (ref 31–125)
CO2: 26 mmol/L (ref 20–32)
Chloride: 101 mmol/L (ref 98–110)
Creat: 0.62 mg/dL (ref 0.50–0.97)
Globulin: 3 g/dL (calc) (ref 1.9–3.7)
Glucose, Bld: 86 mg/dL (ref 65–99)
Sodium: 136 mmol/L (ref 135–146)
Total Protein: 7.1 g/dL (ref 6.1–8.1)

## 2022-07-31 LAB — CELIAC DISEASE PANEL
(tTG) Ab, IgA: <1 U/mL
(tTG) Ab, IgG: <1 U/mL
Gliadin IgA: 1.2 U/mL
Gliadin IgG: <1 U/mL
Immunoglobulin A: 180 mg/dL (ref 47–310)

## 2022-07-31 LAB — VITAMIN D 25 HYDROXY (VIT D DEFICIENCY, FRACTURES): Vit D, 25-Hydroxy: 47 ng/mL (ref 30–100)

## 2022-07-31 LAB — B12 AND FOLATE PANEL
Folate: 23.4 ng/mL
Vitamin B-12: 404 pg/mL (ref 200–1100)

## 2022-07-31 LAB — TSH: TSH: 2.1 mIU/L

## 2022-07-31 NOTE — Assessment & Plan Note (Signed)
Chronic for years per patient. Ordered transvaginal abdominal ultrasound. Trial of pepcid ac otc. Pending celiac labs. Consider GI referral if symptom persists, work up unrevealing.

## 2022-07-31 NOTE — Assessment & Plan Note (Addendum)
No etiology for fatigue at this time.  Suspect multifactorial.  Fatigue is improved with exercise.  Encourage exercise program.  Thus far metabolic etiology in lab work has not been identified.  Consider OSA evaluation.

## 2022-10-10 ENCOUNTER — Encounter: Payer: Self-pay | Admitting: Family

## 2022-11-02 ENCOUNTER — Ambulatory Visit: Payer: Self-pay | Admitting: Family Medicine

## 2022-11-21 ENCOUNTER — Ambulatory Visit: Payer: BC Managed Care – PPO | Admitting: Family Medicine

## 2022-11-21 ENCOUNTER — Encounter: Payer: Self-pay | Admitting: Family Medicine

## 2022-11-21 VITALS — BP 101/66 | HR 73 | Ht 64.0 in | Wt 183.2 lb

## 2022-11-21 DIAGNOSIS — Z8709 Personal history of other diseases of the respiratory system: Secondary | ICD-10-CM | POA: Diagnosis not present

## 2022-11-21 DIAGNOSIS — Z7689 Persons encountering health services in other specified circumstances: Secondary | ICD-10-CM

## 2022-11-21 DIAGNOSIS — D696 Thrombocytopenia, unspecified: Secondary | ICD-10-CM

## 2022-11-21 DIAGNOSIS — E559 Vitamin D deficiency, unspecified: Secondary | ICD-10-CM | POA: Diagnosis not present

## 2022-11-21 DIAGNOSIS — J302 Other seasonal allergic rhinitis: Secondary | ICD-10-CM
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
## 2022-11-21 NOTE — Assessment & Plan Note (Signed)
Chronic  Recently completed abx course  Reports chronic,frequent URI, bronchitis episodes for several years  Recently has daughter in daycare w/ frequent URI Patient also has hx of sinus pressure and congestion Recommended she continue nasacort 1 spray per nostril daily and alternating 10 of cetirizine and 60mg  daily allegra for allergy season  Recommended ENT referral, referral submitted

## 2022-11-21 NOTE — Assessment & Plan Note (Signed)
Welcomed patient to [facility]  Reviewed patient's medical history, medications, surgical and social history Discussed roles and expectations for primary care physician-patient relationship Recommended patient schedule annual preventative examinations   

## 2022-11-22 ENCOUNTER — Encounter: Payer: Self-pay | Admitting: Family Medicine

## 2022-11-22 NOTE — Assessment & Plan Note (Signed)
Chronic  Symptoms present mostly year round  Continue nasacort nasal spray 1 spray per nostril daily, continue cetirizine 10mg  daily (ok to alternate with 60mg  allegra once daily)  Referral to ENT submitted for frequent URI, sinus infections
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
## 2022-11-22 NOTE — Assessment & Plan Note (Signed)
Chronic  Improved, stable  Last plt count in Feb 2024 was within normal range at 145 (reviewed in patient's EMR)  She has been evaluated by heme, recommended to return to specialist care for clinical signs of bleeding of plts drop to 50,000 or less

## 2022-11-22 NOTE — Assessment & Plan Note (Signed)
Chronic Reported hx  Not currently on supplement

## 2023-01-22 ENCOUNTER — Ambulatory Visit: Payer: BC Managed Care – PPO | Admitting: Family

## 2023-06-28 ENCOUNTER — Encounter: Payer: Self-pay | Admitting: Family Medicine

## 2023-07-27 ENCOUNTER — Encounter: Payer: Self-pay | Admitting: Family Medicine

## 2023-09-07 ENCOUNTER — Encounter: Payer: BC Managed Care – PPO | Admitting: Family Medicine

## 2023-10-05 ENCOUNTER — Encounter: Payer: Self-pay | Admitting: Family Medicine

## 2023-10-05 ENCOUNTER — Ambulatory Visit (INDEPENDENT_AMBULATORY_CARE_PROVIDER_SITE_OTHER): Admitting: Family Medicine

## 2023-10-05 VITALS — BP 107/60 | HR 73 | Ht 64.0 in | Wt 190.6 lb

## 2023-10-05 DIAGNOSIS — Z131 Encounter for screening for diabetes mellitus: Secondary | ICD-10-CM | POA: Diagnosis not present

## 2023-10-05 DIAGNOSIS — Z13220 Encounter for screening for lipoid disorders: Secondary | ICD-10-CM | POA: Diagnosis not present

## 2023-10-05 DIAGNOSIS — Z0000 Encounter for general adult medical examination without abnormal findings: Secondary | ICD-10-CM | POA: Diagnosis not present

## 2023-10-05 DIAGNOSIS — J302 Other seasonal allergic rhinitis: Secondary | ICD-10-CM

## 2023-10-05 DIAGNOSIS — Z6832 Body mass index (BMI) 32.0-32.9, adult: Secondary | ICD-10-CM

## 2023-10-05 DIAGNOSIS — E559 Vitamin D deficiency, unspecified: Secondary | ICD-10-CM | POA: Diagnosis not present

## 2023-10-05 DIAGNOSIS — D696 Thrombocytopenia, unspecified: Secondary | ICD-10-CM
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
## 2023-10-06 LAB — CBC
Hematocrit: 39.2 % (ref 34.0–46.6)
Hemoglobin: 12.6 g/dL (ref 11.1–15.9)
MCH: 29.6 pg (ref 26.6–33.0)
MCHC: 32.1 g/dL (ref 31.5–35.7)
MCV: 92 fL (ref 79–97)
Platelets: 106 10*3/uL — ABNORMAL LOW (ref 150–450)
RBC: 4.26 x10E6/uL (ref 3.77–5.28)
RDW: 12.5 % (ref 11.7–15.4)
WBC: 6.1 10*3/uL (ref 3.4–10.8)

## 2023-10-06 LAB — COMPREHENSIVE METABOLIC PANEL WITH GFR
ALT: 12 IU/L (ref 0–32)
AST: 15 IU/L (ref 0–40)
Albumin: 4.2 g/dL (ref 3.9–4.9)
Alkaline Phosphatase: 91 IU/L (ref 44–121)
BUN/Creatinine Ratio: 19 (ref 9–23)
BUN: 13 mg/dL (ref 6–20)
Bilirubin Total: <0.2 mg/dL (ref 0.0–1.2)
CO2: 23 mmol/L (ref 20–29)
Calcium: 9.6 mg/dL (ref 8.7–10.2)
Chloride: 105 mmol/L (ref 96–106)
Creatinine, Ser: 0.7 mg/dL (ref 0.57–1.00)
Globulin, Total: 2.6 g/dL (ref 1.5–4.5)
Glucose: 83 mg/dL (ref 70–99)
Potassium: 4.7 mmol/L (ref 3.5–5.2)
Sodium: 142 mmol/L (ref 134–144)
Total Protein: 6.8 g/dL (ref 6.0–8.5)
eGFR: 113 mL/min/{1.73_m2} (ref >59)

## 2023-10-06 LAB — LIPID PANEL WITH LDL/HDL RATIO
Cholesterol, Total: 148 mg/dL (ref 100–199)
HDL: 62 mg/dL (ref >39)
LDL Chol Calc (NIH): 68 mg/dL (ref 0–99)
LDL/HDL Ratio: 1.1 ratio (ref 0.0–3.2)
Triglycerides: 95 mg/dL (ref 0–149)
VLDL Cholesterol Cal: 18 mg/dL (ref 5–40)

## 2023-10-06 LAB — HEMOGLOBIN A1C
Est. average glucose Bld gHb Est-mCnc: 117 mg/dL
Hgb A1c MFr Bld: 5.7 % — ABNORMAL HIGH (ref 4.8–5.6)

## 2023-10-06 LAB — VITAMIN D 25 HYDROXY (VIT D DEFICIENCY, FRACTURES): Vit D, 25-Hydroxy: 44.6 ng/mL (ref 30.0–100.0)

## 2023-10-06 NOTE — Assessment & Plan Note (Signed)
 Cervical cancer screening with Pap smear scheduled with gynecology. Discussed hepatitis C and HIV screening; declined hepatitis C screening. Discussed COVID-19 and flu vaccinations; declined COVID-19 vaccine but agreed to flu shot. - Proceed with cervical cancer screening with gynecology. - Consider hepatitis C and HIV screening in the future. - Administer flu vaccine.

## 2023-10-06 NOTE — Assessment & Plan Note (Signed)
  Vitamin D  deficiency Vitamin D  deficiency with regular supplementation. - Order vitamin D  level.

## 2023-10-06 NOTE — Assessment & Plan Note (Signed)
 Thrombocytopenia Thrombocytopenia without acute concerns. - Order CBC to monitor platelet levels.

## 2023-10-06 NOTE — Assessment & Plan Note (Addendum)
 Chronic  Now under care of allergy and immunology and tolerating allergy shots  Symptoms have been improved on weekly allergy injections  Continue current management with specialist

## 2023-10-12 ENCOUNTER — Encounter: Payer: Self-pay | Admitting: Family Medicine

## 2023-12-14 ENCOUNTER — Encounter: Payer: Self-pay | Admitting: Family Medicine

## 2024-03-07 ENCOUNTER — Other Ambulatory Visit: Payer: Self-pay | Admitting: Family Medicine

## 2024-03-07 DIAGNOSIS — Z1231 Encounter for screening mammogram for malignant neoplasm of breast: Secondary | ICD-10-CM

## 2024-05-05 ENCOUNTER — Ambulatory Visit
Admission: RE | Admit: 2024-05-05 | Discharge: 2024-05-05 | Disposition: A | Source: Ambulatory Visit | Attending: Family Medicine

## 2024-05-05 DIAGNOSIS — Z1231 Encounter for screening mammogram for malignant neoplasm of breast: Secondary | ICD-10-CM

## 2024-05-09 ENCOUNTER — Other Ambulatory Visit: Payer: Self-pay | Admitting: Family Medicine

## 2024-05-09 DIAGNOSIS — R928 Other abnormal and inconclusive findings on diagnostic imaging of breast: Secondary | ICD-10-CM

## 2024-05-12 ENCOUNTER — Other Ambulatory Visit: Payer: Self-pay | Admitting: Family Medicine

## 2024-05-12 DIAGNOSIS — R928 Other abnormal and inconclusive findings on diagnostic imaging of breast: Secondary | ICD-10-CM

## 2024-05-26 ENCOUNTER — Inpatient Hospital Stay: Admission: RE | Admit: 2024-05-26 | Discharge: 2024-05-26 | Attending: Family Medicine

## 2024-05-26 ENCOUNTER — Encounter

## 2024-05-26 DIAGNOSIS — R928 Other abnormal and inconclusive findings on diagnostic imaging of breast: Secondary | ICD-10-CM

## 2024-10-17 ENCOUNTER — Encounter: Admitting: Family Medicine
